# Patient Record
Sex: Female | Born: 1965 | Race: White | Hispanic: No | Marital: Married | Smoking: Never smoker
Health system: Southern US, Community
[De-identification: ages and names within clinical notes are randomized; demographics above are authoritative.]

## PROBLEM LIST (undated history)

## (undated) DIAGNOSIS — J45909 Unspecified asthma, uncomplicated: Secondary | ICD-10-CM

## (undated) DIAGNOSIS — R519 Headache, unspecified: Secondary | ICD-10-CM

## (undated) DIAGNOSIS — F32A Depression, unspecified: Secondary | ICD-10-CM

## (undated) DIAGNOSIS — R51 Headache: Secondary | ICD-10-CM

## (undated) DIAGNOSIS — F329 Major depressive disorder, single episode, unspecified: Secondary | ICD-10-CM

## (undated) HISTORY — DX: Unspecified asthma, uncomplicated: J45.909

## (undated) HISTORY — PX: GASTRIC BANDING PORT REVISION: SHX5246

## (undated) HISTORY — PX: ABDOMINAL HYSTERECTOMY: SHX81

## (undated) HISTORY — PX: CHOLECYSTECTOMY: SHX55

---

## 2016-09-17 ENCOUNTER — Other Ambulatory Visit (HOSPITAL_COMMUNITY): Payer: Self-pay | Admitting: Surgery

## 2016-09-29 ENCOUNTER — Ambulatory Visit (HOSPITAL_COMMUNITY)
Admission: RE | Admit: 2016-09-29 | Discharge: 2016-09-29 | Disposition: A | Discharge status: Home / Self Care | Payer: 59 | Source: Ambulatory Visit | Attending: Surgery | Admitting: Surgery

## 2016-09-29 ENCOUNTER — Other Ambulatory Visit: Payer: Self-pay

## 2016-09-29 DIAGNOSIS — Z9049 Acquired absence of other specified parts of digestive tract: Secondary | ICD-10-CM | POA: Insufficient documentation

## 2016-09-29 DIAGNOSIS — K219 Gastro-esophageal reflux disease without esophagitis: Secondary | ICD-10-CM | POA: Insufficient documentation

## 2016-10-01 ENCOUNTER — Other Ambulatory Visit: Payer: Self-pay | Admitting: Internal Medicine

## 2016-10-01 DIAGNOSIS — Z1231 Encounter for screening mammogram for malignant neoplasm of breast: Secondary | ICD-10-CM

## 2016-10-08 ENCOUNTER — Ambulatory Visit: Payer: Self-pay | Admitting: Registered"

## 2016-10-17 ENCOUNTER — Ambulatory Visit
Admission: RE | Admit: 2016-10-17 | Discharge: 2016-10-17 | Disposition: A | Discharge status: Home / Self Care | Payer: 59 | Source: Ambulatory Visit | Attending: Internal Medicine | Admitting: Internal Medicine

## 2016-10-17 DIAGNOSIS — Z1231 Encounter for screening mammogram for malignant neoplasm of breast: Secondary | ICD-10-CM

## 2016-10-20 ENCOUNTER — Encounter: Payer: Self-pay | Admitting: Skilled Nursing Facility1

## 2016-10-20 ENCOUNTER — Encounter: Payer: 59 | Attending: Surgery | Admitting: Skilled Nursing Facility1

## 2016-10-20 DIAGNOSIS — E669 Obesity, unspecified: Secondary | ICD-10-CM

## 2016-10-20 DIAGNOSIS — Z713 Dietary counseling and surveillance: Secondary | ICD-10-CM | POA: Diagnosis not present

## 2016-10-20 NOTE — Patient Instructions (Signed)
Follow Pre-Op Goals Try Protein Shakes Call [facility] at [phone] when surgery is scheduled to enroll in Pre-Op Class  Things to remember:  Please always be honest with us. We want to support you!  If you have any questions or concerns in between appointments, please call or email   The diet after surgery will be high protein and low in carbohydrate.  Vitamins and calcium need to be taken for the rest of your life.  Feel free to include support people in any classes or appointments.  Simple twist, crystal light, mio drips, coconut water: anything with no carbohydrates and no sugar is fine   Supplement recommendations:  Before Surgery   1 Complete Multivitamin with Iron  3000 IU Vitamin D3  After Surgery   2 Chewable Multivitamins  **Best Choice - Opurity: Bypass and Sleeve Optimized Chewable    Bariatric Advantage Advanced Multi EA      3 Chewable Calcium (500 mg each, total 1200-1500 mg per day)  **Best Choice - Celebrate, Bariatric Advantage, or Wellesse  Other Options:  2 Celebrate MultiComplete with 18 mg Iron (this provides 6000 IU of  Vitamin D3)  4 Celebrate Essential Multi 2 in 1 (has calcium) + 18-60 mg separate  iron  Vitamins and Calcium are available at:   [facility]   [address], [place], [place] [id number]   [email or web address]  [email or web address]  [email or web address]

## 2016-10-20 NOTE — Progress Notes (Signed)
Pre-Op Assessment Visit:  Pre-Operative Sleeve Gastrectomy Surgery  Medical Nutrition Therapy:  Appt start time: 4:10  End time:  5:10  Patient was seen on 10/20/2016 for Pre-Operative Nutrition Assessment. Assessment and letter of approval [facility] to [facility] coordinator on 10/20/2016.   Pt states she only gets about 16 ounces of fluid a day. Pt states she cannot stand any seafood stating the smell of fish makes her sick. Pt states if she can lose the weight she may consider not having surgery.   Pt expectation of surgery: "to help me lose weight and keep the weight off"  Pt expectation of Dietitian:  "To have someone to show me what to eat"  Start weight at [facility]: 256.11.2 BMI: 50.98  24 hr Dietary Recall: mostly skipping meals/irradic  First Meal:  Snack:  Second Meal: spagtti Snack:  Third Meal: popcorn  Snack:  Beverages: water, Gatorade, soda  Encouraged to engage in 150 minutes of moderate physical activity including cardiovascular and weight baring weekly  Handouts given during visit include:  . Pre-Op Goals . Bariatric Surgery Protein Shakes During the appointment today the following Pre-Op Goals were reviewed with the patient: . Maintain or lose weight as instructed by your surgeon . Make healthy food choices . Begin to limit portion sizes . Limited concentrated sugars and fried foods . Keep fat/sugar in the single digits per serving on             food labels . Practice CHEWING your food  (aim for 30 chews per bite or until applesauce consistency) . Practice not drinking 15 minutes before, during, and 30 minutes after each meal/snack . Avoid all carbonated beverages  . Avoid/limit caffeinated beverages  . Avoid all sugar-sweetened beverages . Consume 3 meals per day; eat every 3-5 hours . Make a list of non-food related activities . Aim for 64-100 ounces of FLUID daily  . Aim for at least 60-80 grams of PROTEIN  daily . Look for a liquid protein source that contain ?15 g protein and ?5 g carbohydrate  (ex: shakes, drinks, shots)  -Follow diet recommendations listed below   Energy and Macronutrient Recomendations: Calories: 1500 Carbohydrate: 170 Protein: 112 Fat: 42  Demonstrated degree of understanding via:  Teach Back  Teaching Method Utilized:  Visual Auditory Hands on  Barriers to learning/adherence to lifestyle change: has not worked on her relationship with food before  Patient to call the [facility] to enroll in Pre-Op and Post-Op Nutrition Education when surgery date is scheduled.

## 2016-11-20 ENCOUNTER — Ambulatory Visit: Payer: 59 | Admitting: Skilled Nursing Facility1

## 2016-12-11 ENCOUNTER — Ambulatory Visit: Payer: 59 | Admitting: Skilled Nursing Facility1

## 2016-12-15 ENCOUNTER — Encounter: Payer: Self-pay | Admitting: Skilled Nursing Facility1

## 2016-12-15 ENCOUNTER — Encounter: Payer: 59 | Attending: Surgery | Admitting: Skilled Nursing Facility1

## 2016-12-15 DIAGNOSIS — Z713 Dietary counseling and surveillance: Secondary | ICD-10-CM | POA: Diagnosis not present

## 2016-12-15 DIAGNOSIS — E669 Obesity, unspecified: Secondary | ICD-10-CM

## 2016-12-15 NOTE — Progress Notes (Signed)
  Assessment:   1st SWL Appointment.   Pt arrives having lost about 6 pounds. Pt states she mostly drinks water now. Pt states she has not eaten any ice cream since the last visit. Pt states her strategy was to just not do it. Pt states for the next appointment she was going to work on chewing until applesauce consistency and having a vegetable with every lunch and dinner consistently.    Start Wt at [facility]: 256.11.2 Wt: 250.12.8 pounds BMI: 49.81  MEDICATIONS: See List   DIETARY INTAKE:  24-hr recall:  B ( AM): cottage cheese with fruit Snk ( AM):  L ( PM): beef stew just beef  Snk ( PM):  D ( PM): beef stew just beef Snk ( PM):  Beverages: water  Usual physical activity: ADL's  Diet to Follow: 1400 calories 158 g carbohydrates 105 g protein 39 g fat   Nutritional Diagnosis:  [place]-3.3 Overweight/obesity related to past poor dietary habits and physical inactivity as evidenced by patient w/ planned sleeve gastrectomy surgery following dietary guidelines for continued weight loss.    Intervention:  Nutrition counseling for upcoming Bariatric Surgery. Goals: -Encouraged to engage in 150 minutes of moderate physical activity including cardiovascular and weight baring weekly  Teaching Method Utilized:  Visual Auditory Hands on  Barriers to learning/adherence to lifestyle change: none identified   Demonstrated degree of understanding via:  Teach Back   Monitoring/Evaluation:  Dietary intake, exercise, and body weight prn.

## 2017-01-01 ENCOUNTER — Ambulatory Visit: Payer: 59 | Admitting: Psychiatry

## 2017-01-12 ENCOUNTER — Encounter: Payer: Self-pay | Admitting: Skilled Nursing Facility1

## 2017-01-12 ENCOUNTER — Encounter: Payer: 59 | Attending: Surgery | Admitting: Skilled Nursing Facility1

## 2017-01-12 DIAGNOSIS — E669 Obesity, unspecified: Secondary | ICD-10-CM

## 2017-01-12 DIAGNOSIS — Z713 Dietary counseling and surveillance: Secondary | ICD-10-CM | POA: Insufficient documentation

## 2017-01-12 NOTE — Progress Notes (Signed)
  Assessment:   2nd SWL Appointment.   Pt arrives with a substantial migraine and uneasy on her feet. Pt states she Need 4 more visits. Pt states she is upset she has only lost 1 pound since her last visit.  Start Wt at [facility]: 256.11.2 Wt: 249 pounds BMI: 49.45  MEDICATIONS: See List   DIETARY INTAKE:  24-hr recall:  B ( AM): cottage cheese with fruit Snk ( AM):  L ( PM): cup of spagetti Snk ( PM):  D ( PM): meat and brussel sprouts  Snk ( PM):  Beverages: water 3-4 16.9 bottles of water  Usual physical activity: ADL's  Diet to Follow: 1400 calories 158 g carbohydrates 105 g protein 39 g fat   Nutritional Diagnosis:  [place]-3.3 Overweight/obesity related to past poor dietary habits and physical inactivity as evidenced by patient w/ planned sleeve gastrectomy surgery following dietary guidelines for continued weight loss.    Intervention:  Nutrition counseling for upcoming Bariatric Surgery. Goals: -Encouraged to engage in 150 minutes of moderate physical activity including cardiovascular and weight baring weekly -Have real meals: carbohydrate, vegetable, protein  Teaching Method Utilized:  Visual Auditory Hands on  Barriers to learning/adherence to lifestyle change: none identified   Demonstrated degree of understanding via:  Teach Back   Monitoring/Evaluation:  Dietary intake, exercise, and body weight prn.

## 2017-01-27 ENCOUNTER — Ambulatory Visit (INDEPENDENT_AMBULATORY_CARE_PROVIDER_SITE_OTHER): Payer: 59 | Admitting: Psychiatry

## 2017-01-27 DIAGNOSIS — F509 Eating disorder, unspecified: Secondary | ICD-10-CM

## 2017-02-11 ENCOUNTER — Encounter: Payer: 59 | Attending: Surgery | Admitting: Skilled Nursing Facility1

## 2017-02-11 ENCOUNTER — Encounter: Payer: Self-pay | Admitting: Skilled Nursing Facility1

## 2017-02-11 DIAGNOSIS — E669 Obesity, unspecified: Secondary | ICD-10-CM

## 2017-02-11 DIAGNOSIS — Z713 Dietary counseling and surveillance: Secondary | ICD-10-CM | POA: Insufficient documentation

## 2017-02-11 NOTE — Patient Instructions (Signed)
-  Get a multivitamin and take one every day  -15 grams of protein and 5 grams or less of carbohydrates

## 2017-02-11 NOTE — Progress Notes (Signed)
  Assessment:   3rd SWL Appointment.   Pt arrives having lost about 2 pound and a smaller jean size. Pt states she Need 3 more visits. Pt states she is upset she has only lost 1 pound since her last visit. Pt states she is a very slow eater. Pt states she is does not struggle with drinking with meals. Pt states the one thing she is going to struggle with the most is taking a multivitamin every day for the rest of her life.  Start Wt at [facility]: 256.11.2 Wt: 247 pounds BMI: 49.89  MEDICATIONS: See List   DIETARY INTAKE:  24-hr recall:  B ( AM): cottage cheese with fruit or yogurt Snk ( AM):  L ( PM): hamburger with tomatoes and brussel sprouts  Snk ( PM):  D ( PM): meat and brussel sprouts or soup and grilled cheese Snk ( PM):  Beverages: water 3-4 16.9 bottles of water  Usual physical activity: walking 20 minutes 4 days a week  Diet to Follow: 1400 calories 158 g carbohydrates 105 g protein 39 g fat   Nutritional Diagnosis:  [place]-3.3 Overweight/obesity related to past poor dietary habits and physical inactivity as evidenced by patient w/ planned sleeve gastrectomy surgery following dietary guidelines for continued weight loss.    Intervention:  Nutrition counseling for upcoming Bariatric Surgery. Goals: -Encouraged to engage in 150 minutes of moderate physical activity including cardiovascular and weight baring weekly -Get a multivitamin and take one every day -15 grams of protein and 5 grams or less of carbohydrates  Teaching Method Utilized:  Visual Auditory Hands on  Barriers to learning/adherence to lifestyle change: none identified   Demonstrated degree of understanding via:  Teach Back   Monitoring/Evaluation:  Dietary intake, exercise, and body weight prn.

## 2017-02-12 ENCOUNTER — Ambulatory Visit: Payer: Self-pay | Admitting: Skilled Nursing Facility1

## 2017-03-12 ENCOUNTER — Encounter: Payer: Self-pay | Admitting: Skilled Nursing Facility1

## 2017-03-12 ENCOUNTER — Encounter: Payer: 59 | Attending: Surgery | Admitting: Skilled Nursing Facility1

## 2017-03-12 DIAGNOSIS — Z713 Dietary counseling and surveillance: Secondary | ICD-10-CM | POA: Insufficient documentation

## 2017-03-12 DIAGNOSIS — E669 Obesity, unspecified: Secondary | ICD-10-CM

## 2017-03-12 NOTE — Progress Notes (Signed)
  Assessment:   4th SWL Appointment.    Pt states she is upset she has only lost 1 pound since her last visit. Pt states she is a very slow eater. Pt states she is does not struggle with drinking with meals. Pt states the one thing she is going to struggle with the most is taking a multivitamin every day for the rest of her life.  Pt arrived having lost about 3 pounds. Pt states she has had pnumonia. Pt states she has been taking a multivitamin every day. Pt states she has been on an antibiotic. Pt states she is not a vegetable person and absolutely hates asparagus. Pt state she needs 6 months of SWL.  Dietitian talked with pt about exploring different vegetables and how to make them to like them just as much as how she ate previously.   Needing 2 more visits.    Start Wt at [facility]: 256.11.2 Wt: 244 pounds BMI: 48.56  MEDICATIONS: See List   DIETARY INTAKE:  24-hr recall:  B ( AM): cottage cheese with fruit or yogurt Snk ( AM):  L ( PM): hamburger with tomatoes and brussel sprouts---soup Snk ( PM):  D ( PM): meat and brussel sprouts or soup and grilled cheese or mashed potaotes and peas Snk ( PM):  Beverages: water 3-4 16.9 bottles of water  Usual physical activity: walking 20 minutes 4 days a week (not since being sick)  Diet to Follow: 1400 calories 158 g carbohydrates 105 g protein 39 g fat   Nutritional Diagnosis:  [place]-3.3 Overweight/obesity related to past poor dietary habits and physical inactivity as evidenced by patient w/ planned sleeve gastrectomy surgery following dietary guidelines for continued weight loss.    Intervention:  Nutrition counseling for upcoming Bariatric Surgery. Goals: -Encouraged to engage in 150 minutes of moderate physical activity including cardiovascular and weight baring weekly -Try kefir in the yogurt  -Dabble in some vegetables: Google different recipes  -Work on seeing the success not just the number/Loving yourself   Teaching Method  Utilized:  Visual Auditory Hands on  Barriers to learning/adherence to lifestyle change: none identified   Demonstrated degree of understanding via:  Teach Back   Monitoring/Evaluation:  Dietary intake, exercise, and body weight prn.

## 2017-03-12 NOTE — Patient Instructions (Addendum)
-  Try kefir in the yogurt   -Dabble in some vegetables   -Work on seeing the success not just the number/Loving yourself

## 2017-03-17 ENCOUNTER — Ambulatory Visit: Payer: 59 | Admitting: Psychiatry

## 2017-04-15 ENCOUNTER — Encounter: Payer: 59 | Attending: Surgery | Admitting: Skilled Nursing Facility1

## 2017-04-15 ENCOUNTER — Encounter: Payer: Self-pay | Admitting: Skilled Nursing Facility1

## 2017-04-15 DIAGNOSIS — Z713 Dietary counseling and surveillance: Secondary | ICD-10-CM | POA: Diagnosis not present

## 2017-04-15 DIAGNOSIS — E669 Obesity, unspecified: Secondary | ICD-10-CM

## 2017-04-15 NOTE — Progress Notes (Signed)
  Assessment:   5th SWL Appointment.   Needing 1 more visit. Pt arrives having gained about 5 pounds.  Pt states she has been so sick from pneumonia and needing steroids she has been going to bed early without having dinner. Pt states after working 10 hour days she comes home tired.  Pt states she feels successful with: not drinking soda, not drinking with meals  Room to grow: eating 3 meals a day which will be worked on for the next month   [facility] 256.11.2 Wt: 249.3 pounds BMI: 49.51  MEDICATIONS: See List   DIETARY INTAKE:  24-hr recall:  B ( AM): cottage cheese with fruit or yogurt Snk ( AM):  L ( PM): hamburger with tomatoes and brussel sprouts---soup---hamburger with macaroni and corn---chili Snk ( PM):  D ( PM): meat and brussel sprouts or soup and grilled cheese or mashed potaotes and peas---skipping mostly or tomato soup with grilled cheese Snk ( PM):  Beverages: water 3-4 16.9 bottles of water, whole milk  Usual physical activity: walking 20 minutes 4 days a week (not since being sick)  Diet to Follow: 1400 calories 158 g carbohydrates 105 g protein 39 g fat   Nutritional Diagnosis:  [place]-3.3 Overweight/obesity related to past poor dietary habits and physical inactivity as evidenced by patient w/ planned sleeve gastrectomy surgery following dietary guidelines for continued weight loss.    Intervention:  Nutrition counseling for upcoming Bariatric Surgery. Goals: -Encouraged to engage in 150 minutes of moderate physical activity including cardiovascular and weight baring weekly -when not feeling very hungry but it has been over 5 hours: tomatoes, cucumber, cottage cheese, or yogurt -Say at least 1 nice thing to yourself every day  Teaching Method Utilized:  Visual Auditory Hands on  Barriers to learning/adherence to lifestyle change: none identified   Demonstrated degree of understanding via:  Teach Back   Monitoring/Evaluation:  Dietary intake,  exercise, and body weight prn.

## 2017-04-15 NOTE — Patient Instructions (Addendum)
-  when not feeling very hungry but it has been over 5 hours: tomatoes, cucumber, cottage cheese, or yogurt  -Say at least 1 nice thing to yourself every day

## 2017-04-23 ENCOUNTER — Ambulatory Visit: Payer: Self-pay | Admitting: Psychiatry

## 2017-04-30 ENCOUNTER — Ambulatory Visit (INDEPENDENT_AMBULATORY_CARE_PROVIDER_SITE_OTHER): Payer: Self-pay | Admitting: Psychiatry

## 2017-04-30 DIAGNOSIS — F509 Eating disorder, unspecified: Secondary | ICD-10-CM

## 2017-05-18 ENCOUNTER — Ambulatory Visit: Payer: Self-pay | Admitting: Skilled Nursing Facility1

## 2017-05-20 ENCOUNTER — Ambulatory Visit: Payer: Self-pay | Admitting: Skilled Nursing Facility1

## 2017-05-21 ENCOUNTER — Ambulatory Visit: Payer: Self-pay | Admitting: Skilled Nursing Facility1

## 2017-05-25 ENCOUNTER — Encounter: Payer: 59 | Attending: Surgery | Admitting: Skilled Nursing Facility1

## 2017-05-25 ENCOUNTER — Encounter: Payer: Self-pay | Admitting: Skilled Nursing Facility1

## 2017-05-25 DIAGNOSIS — E669 Obesity, unspecified: Secondary | ICD-10-CM

## 2017-05-25 DIAGNOSIS — Z713 Dietary counseling and surveillance: Secondary | ICD-10-CM | POA: Diagnosis not present

## 2017-05-25 NOTE — Progress Notes (Signed)
  Assessment:   6th SWL Appointment.  Pt states she is feeling irritated from being sick all the time with upper respiratory issues. Pt has completed her SWL appt.    Start Wt at [facility]: 256.11.2 Wt: 250 pounds BMI: 49.81  MEDICATIONS: See List   DIETARY INTAKE:  24-hr recall:  B ( AM): cottage cheese with fruit or yogurt or egg and toast and bacon or ham Snk ( AM): mixed nuts with raisins  L ( PM): hamburger with tomatoes and brussel sprouts---soup---hamburger with macaroni and corn---chili----popcorn  Snk ( PM):  D ( PM): meat and brussel sprouts or soup and grilled cheese or mashed potaotes and peas---skipping mostly or tomato soup with grilled cheese Snk ( PM):  Beverages: water 3-4 16.9 bottles of water, whole milk  Usual physical activity: walking 20 minutes 4 days a week (not since being sick); some chair exercises   Diet to Follow: 1400 calories 158 g carbohydrates 105 g protein 39 g fat   Nutritional Diagnosis:  [place]-3.3 Overweight/obesity related to past poor dietary habits and physical inactivity as evidenced by patient w/ planned sleeve gastrectomy surgery following dietary guidelines for continued weight loss.    Intervention:  Nutrition counseling for upcoming Bariatric Surgery. Goals: -Encouraged to engage in 150 minutes of moderate physical activity including cardiovascular and weight baring weekly -when not feeling very hungry but it has been over 5 hours: tomatoes, cucumber, cottage cheese, or yogurt -Say at least 1 nice thing to yourself every day  Teaching Method Utilized:  Visual Auditory Hands on  Barriers to learning/adherence to lifestyle change: none identified   Demonstrated degree of understanding via:  Teach Back   Monitoring/Evaluation:  Dietary intake, exercise, and body weight prn.

## 2017-06-08 ENCOUNTER — Encounter: Payer: 59 | Admitting: Skilled Nursing Facility1

## 2017-06-08 DIAGNOSIS — E669 Obesity, unspecified: Secondary | ICD-10-CM

## 2017-06-10 ENCOUNTER — Encounter: Payer: Self-pay | Admitting: Skilled Nursing Facility1

## 2017-06-10 NOTE — Progress Notes (Signed)
Pre-Operative Nutrition Class:  Appt start time: 1840   End time:  1830.  Patient was seen on 06/08/2017 for Pre-Operative Bariatric Surgery Education [facility].   Surgery date:  Surgery type: sleeve Start weight at [facility]: 256.11 Weight today: 251.4  Samples given per MNT protocol. Patient educated on appropriate usage: Bariatric Advantage Multivitamin Lot # [id number] Exp: 07/19  Bariatric Advantage Calcium  Lot # [id number] Exp: Jan 24 2018  Unjury Protein Powder   Lot # [id number] Exp: 04/2019  The following the learning objectives were met by the patient during this course:  Identify Pre-Op Dietary Goals and will begin 2 weeks pre-operatively  Identify appropriate sources of fluids and proteins   State protein recommendations and appropriate sources pre and post-operatively  Identify Post-Operative Dietary Goals and will follow for 2 weeks post-operatively  Identify appropriate multivitamin and calcium sources  Describe the need for physical activity post-operatively and will follow MD recommendations  State when to call healthcare provider regarding medication questions or post-operative complications  Handouts given during class include:  Pre-Op Bariatric Surgery Diet Handout  Protein Shake Handout  Post-Op Bariatric Surgery Nutrition Handout  [facility] Program Information Flyer  Support Group Information Flyer  [facility] Bariatric Supplements Price List  Follow-Up Plan: Patient will follow-up at [facility] 2 weeks post operatively for diet advancement per MD.

## 2017-08-17 MED FILL — oxyCODONE HCL 5 MG/5ML SOLN: 5 | 2 days supply | Qty: 120 | Fill #0

## 2017-08-17 MED FILL — ONDANSETRON ODT 4 MG TABLET: 4 | 6 days supply | Qty: 20 | Fill #0

## 2017-08-17 MED FILL — PANTOPRAZOLE SOD DR 40 MG T: 40 | 21 days supply | Qty: 21 | Fill #0

## 2017-08-25 NOTE — Patient Instructions (Addendum)
Glyn AdeLola Stueve  08/25/2017   Your procedure is scheduled on: Tuesday 09/01/2017  [facility]  [facility]   ARRIVE AT 530 AM. Have a seat in the Main Lobby. Please note there is a phone at the [employer]k. Please call [phone] on that phone. Someone from Short Stay will come and get you from the Main Lobby and take you to Short Stay.   Call this number if you have problems the morning of surgery [phone]    Remember: Do not eat food or drink liquids :After Midnight.      Take these medicines the morning of surgery with A SIP OF WATER: Bupropion (Wellbutrin XL), use Albuterol inhaler if needed and use Breo Ellipta inhaler and bring inhalers with you to the hospital                                You may not have any metal on your body including hair pins and              piercings  Do not wear jewelry, make-up, lotions, powders or perfumes, deodorant             Do not wear nail polish.  Do not shave  48 hours prior to surgery.              Men may shave face and neck.   Do not bring valuables to the hospital. [facility] IS NOT             RESPONSIBLE   FOR VALUABLES.  Contacts, dentures or bridgework may not be worn into surgery.  Leave suitcase in the car. After surgery it may be brought to your room.                  Please read over the following fact sheets you were given: _____________________________________________________________________             [facility] - Preparing for [facility] Before surgery, you can play an important role.  Because skin is not sterile, your skin needs to be as free of germs as possible.  You can reduce the number of germs on your skin by washing with CHG (chlorahexidine gluconate) soap before surgery.  CHG is an antiseptic cleaner which kills germs and bonds with the skin to continue killing germs even after washing. Please DO NOT use if you have an allergy to CHG or antibacterial soaps.  If  your skin becomes reddened/irritated stop using the CHG and inform your nurse when you arrive at Short Stay. Do not shave (including legs and underarms) for at least 48 hours prior to the first CHG shower.  You may shave your face/neck. Please follow these instructions carefully:  1.  Shower with CHG Soap the night before surgery and the  morning of Surgery.  2.  If you choose to wash your hair, wash your hair first as usual with your  normal  shampoo.  3.  After you shampoo, rinse your hair and body thoroughly to remove the  shampoo.                           4.  Use CHG as you would any other liquid soap.  You can apply chg directly  to the skin  and wash                       Gently with a scrungie or clean washcloth.  5.  Apply the CHG Soap to your body ONLY FROM THE NECK DOWN.   Do not use on face/ open                           Wound or open sores. Avoid contact with eyes, ears mouth and genitals (private parts).                       Wash face,  Genitals (private parts) with your normal soap.             6.  Wash thoroughly, paying special attention to the area where your surgery  will be performed.  7.  Thoroughly rinse your body with warm water from the neck down.  8.  DO NOT shower/wash with your normal soap after using and rinsing off  the CHG Soap.                9.  Pat yourself dry with a clean towel.            10.  Wear clean pajamas.            11.  Place clean sheets on your bed the night of your first shower and do not  sleep with pets. Day of Surgery : Do not apply any lotions/deodorants the morning of surgery.  Please wear clean clothes to the hospital/surgery center.  FAILURE TO FOLLOW THESE INSTRUCTIONS MAY RESULT IN THE CANCELLATION OF YOUR SURGERY PATIENT SIGNATURE_________________________________  NURSE SIGNATURE__________________________________  ________________________________________________________________________   Adam Phenix  An incentive  spirometer is a tool that can help keep your lungs clear and active. This tool measures how well you are filling your lungs with each breath. Taking long deep breaths may help reverse or decrease the chance of developing breathing (pulmonary) problems (especially infection) following:  A long period of time when you are unable to move or be active. BEFORE THE PROCEDURE   If the spirometer includes an indicator to show your best effort, your nurse or respiratory therapist will set it to a desired goal.  If possible, sit up straight or lean slightly forward. Try not to slouch.  Hold the incentive spirometer in an upright position. INSTRUCTIONS FOR USE  1. Sit on the edge of your bed if possible, or sit up as far as you can in bed or on a chair. 2. Hold the incentive spirometer in an upright position. 3. Breathe out normally. 4. Place the mouthpiece in your mouth and seal your lips tightly around it. 5. Breathe in slowly and as deeply as possible, raising the piston or the ball toward the top of the column. 6. Hold your breath for 3-5 seconds or for as long as possible. Allow the piston or ball to fall to the bottom of the column. 7. Remove the mouthpiece from your mouth and breathe out normally. 8. Rest for a few seconds and repeat Steps 1 through 7 at least 10 times every 1-2 hours when you are awake. Take your time and take a few normal breaths between deep breaths. 9. The spirometer may include an indicator to show your best effort. Use the indicator as a goal to work toward during each repetition. 10. After each set of 10  deep breaths, practice coughing to be sure your lungs are clear. If you have an incision (the cut made at the time of surgery), support your incision when coughing by placing a pillow or rolled up towels firmly against it. Once you are able to get out of bed, walk around indoors and cough well. You may stop using the incentive spirometer when instructed by your caregiver.   RISKS AND COMPLICATIONS  Take your time so you do not get dizzy or light-headed.  If you are in pain, you may need to take or ask for pain medication before doing incentive spirometry. It is harder to take a deep breath if you are having pain. AFTER USE  Rest and breathe slowly and easily.  It can be helpful to keep track of a log of your progress. Your caregiver can provide you with a simple table to help with this. If you are using the spirometer at home, follow these instructions: [place] IF:   You are having difficultly using the spirometer.  You have trouble using the spirometer as often as instructed.  Your pain medication is not giving enough relief while using the spirometer.  You develop fever of 100.5 F (38.1 C) or higher. SEEK IMMEDIATE MEDICAL CARE IF:   You cough up bloody sputum that had not been present before.  You develop fever of 102 F (38.9 C) or greater.  You develop worsening pain at or near the incision site. MAKE SURE YOU:   Understand these instructions.  Will watch your condition.  Will get help right away if you are not doing well or get worse. Document Released: 09/15/2006 Document Revised: 07/28/2011 Document Reviewed: 11/16/2006 [facility] Patient Information 2014 [place], [place].   ________________________________________________________________________

## 2017-08-26 ENCOUNTER — Encounter (HOSPITAL_COMMUNITY)
Admission: RE | Admit: 2017-08-26 | Discharge: 2017-08-26 | Disposition: A | Discharge status: Home / Self Care | Payer: 59 | Source: Ambulatory Visit | Attending: Surgery | Admitting: Surgery

## 2017-08-26 ENCOUNTER — Encounter (HOSPITAL_COMMUNITY): Payer: Self-pay

## 2017-08-26 ENCOUNTER — Other Ambulatory Visit: Payer: Self-pay

## 2017-08-26 DIAGNOSIS — Z01812 Encounter for preprocedural laboratory examination: Secondary | ICD-10-CM | POA: Insufficient documentation

## 2017-08-26 HISTORY — DX: Headache: R51

## 2017-08-26 HISTORY — DX: Major depressive disorder, single episode, unspecified: F32.9

## 2017-08-26 HISTORY — DX: Depression, unspecified: F32.A

## 2017-08-26 HISTORY — DX: Headache, unspecified: R51.9

## 2017-08-26 LAB — BASIC METABOLIC PANEL
ANION GAP: 9 (ref 5–15)
BUN: 18 mg/dL (ref 6–20)
CALCIUM: 9.8 mg/dL (ref 8.9–10.3)
CO2: 24 mmol/L (ref 22–32)
Chloride: 106 mmol/L (ref 101–111)
Creatinine, Ser: 0.54 mg/dL (ref 0.44–1.00)
Glucose, Bld: 93 mg/dL (ref 65–99)
Potassium: 3.9 mmol/L (ref 3.5–5.1)
SODIUM: 139 mmol/L (ref 135–145)

## 2017-08-26 LAB — CBC
HCT: 42 % (ref 36.0–46.0)
Hemoglobin: 14.4 g/dL (ref 12.0–15.0)
MCH: 32.1 pg (ref 26.0–34.0)
MCHC: 34.3 g/dL (ref 30.0–36.0)
MCV: 93.8 fL (ref 78.0–100.0)
PLATELETS: 286 10*3/uL (ref 150–400)
RBC: 4.48 MIL/uL (ref 3.87–5.11)
RDW: 13 % (ref 11.5–15.5)
WBC: 11 10*3/uL — AB (ref 4.0–10.5)

## 2017-08-26 NOTE — Progress Notes (Signed)
09/29/2016- noted in Epic- EKG and CXR

## 2017-08-27 NOTE — H&P (Signed)
Chief Complaint:  Weight regain after removal of lapband  History of Present Illness:  Claudia Cole is an 52 y.o. female who previously had a Lapband placed in [place] and because of intractable nausea and vomiting had it removed in [place], [place].   Since then her BMI has justed from the low 40s o the 50s.    Informed consent obtained in the office regarding sleeve gastrectomy with its increased risks due to her prior lapband surgery.  She understands and accepts these risks.    Past Medical History:  Diagnosis Date  . Asthma    seasonal  . Depression   . Headache    migraines    Past Surgical History:  Procedure Laterality Date  . ABDOMINAL HYSTERECTOMY    . CESAREAN SECTION    . CHOLECYSTECTOMY    . GASTRIC BANDING PORT REVISION      No current facility-administered medications for this encounter.    Current Outpatient Medications  Medication Sig Dispense Refill  . albuterol (PROVENTIL HFA;VENTOLIN HFA) 108 (90 Base) MCG/ACT inhaler Inhale 1 puff into the lungs every 6 (six) hours as needed.    Marland Kitchen buPROPion (WELLBUTRIN XL) 300 MG 24 hr tablet Take 300 mg by mouth every morning.  5  . Calcium Citrate-Vitamin D (CALCIUM CITRATE CHEWY BITE PO) Take 1 each by mouth daily.    . fluticasone furoate-vilanterol (BREO ELLIPTA) 200-25 MCG/INH AEPB Inhale 1 puff into the lungs daily.    Marland Kitchen levocetirizine (XYZAL) 5 MG tablet Take 5 mg by mouth every evening.    . meloxicam (MOBIC) 7.5 MG tablet Take 1 tablet by mouth daily.  0  . montelukast (SINGULAIR) 10 MG tablet Take 1 tablet by mouth every evening. Around 4p  5  . Multiple Vitamins-Minerals (BARIATRIC FUSION PO) Take 1 tablet by mouth 2 (two) times daily.    Marland Kitchen zolpidem (AMBIEN CR) 12.5 MG CR tablet Take 1 tablet by mouth at bedtime.  2   Keflex [cephalexin] Family History  Problem Relation Age of Onset  . Asthma Other   . Cancer Other   . Diabetes Other   . Heart disease Other   . COPD Other    Social History:   reports that she has  never smoked. She has never used smokeless tobacco. She reports that she drinks alcohol. She reports that she does not use drugs.   REVIEW OF SYSTEMS : Negative except for negative for dVT  Physical Exam:   There were no vitals taken for this visit. There is no height or weight on file to calculate BMI.  Gen:  WDWN WF NAD  Neurological: Alert and oriented to person, place, and time. Motor and sensory function is grossly intact  Head: Normocephalic and atraumatic-glasses  Eyes: Conjunctivae are normal. Pupils are equal, round, and reactive to light. No scleral icterus.  Neck: Normal range of motion. Neck supple. No tracheal deviation or thyromegaly present.  Cardiovascular:  SR without murmurs or gallops.  No carotid bruits Breast:  Not examined Respiratory: Effort normal.  No respiratory distress. No chest wall tenderness. Breath sounds normal.  No wheezes, rales or rhonchi.  Abdomen:  Prior laparoscopic scars GU:  Not examined.  Musculoskeletal: Normal range of motion. Extremities are nontender. No cyanosis, edema or clubbing noted Lymphadenopathy: No cervical, preauricular, postauricular or axillary adenopathy is present Skin: Skin is warm and dry. No rash noted. No diaphoresis. No erythema. No pallor. Pscyh: Normal mood and affect. Behavior is normal. Judgment and thought content normal.  LABORATORY RESULTS: Results for orders placed or performed during the hospital encounter of 08/26/17 (from the past 48 hour(s))  Basic metabolic panel     Status: None   Collection Time: 08/26/17  2:35 PM  Result Value Ref Range   Sodium 139 135 - 145 mmol/L   Potassium 3.9 3.5 - 5.1 mmol/L   Chloride 106 101 - 111 mmol/L   CO2 24 22 - 32 mmol/L   Glucose, Bld 93 65 - 99 mg/dL   BUN 18 6 - 20 mg/dL   Creatinine, Ser 0.54 0.44 - 1.00 mg/dL   Calcium 9.8 8.9 - 10.3 mg/dL   GFR calc non Af Amer >60 >60 mL/min   GFR calc Af Amer >60 >60 mL/min    Comment: (NOTE) The eGFR has been calculated  using the CKD EPI equation. This calculation has not been validated in all clinical situations. eGFR's persistently <60 mL/min signify possible Chronic Kidney Disease.    Anion gap 9 5 - 15    Comment: Performed at [facility], [place] [address] [place], [place] [address]  CBC     Status: Abnormal   Collection Time: 08/26/17  2:35 PM  Result Value Ref Range   WBC 11.0 (H) 4.0 - 10.5 K/uL   RBC 4.48 3.87 - 5.11 MIL/uL   Hemoglobin 14.4 12.0 - 15.0 g/dL   HCT 42.0 36.0 - 46.0 %   MCV 93.8 78.0 - 100.0 fL   MCH 32.1 26.0 - 34.0 pg   MCHC 34.3 30.0 - 36.0 g/dL   RDW 13.0 11.5 - 15.5 %   Platelets 286 150 - 400 K/uL    Comment: Performed at [facility], [place] [address] [place], [place] [address]     RADIOLOGY RESULTS: No results found.  Problem List: There are no active problems to display for this patient.   Assessment & Plan: BMI 50 for sleeve gastrectomy    Matt B. Hassell Done, MD, [facility] [phone] beeper [phone]  08/27/2017 9:07 PM

## 2017-09-01 ENCOUNTER — Inpatient Hospital Stay (HOSPITAL_COMMUNITY): Payer: 59 | Admitting: Certified Registered Nurse Anesthetist

## 2017-09-01 ENCOUNTER — Other Ambulatory Visit: Payer: Self-pay

## 2017-09-01 ENCOUNTER — Encounter (HOSPITAL_COMMUNITY): Payer: Self-pay

## 2017-09-01 ENCOUNTER — Inpatient Hospital Stay (HOSPITAL_COMMUNITY)
Admission: RE | Admit: 2017-09-01 | Discharge: 2017-09-02 | DRG: 621 | Disposition: A | Discharge status: Home / Self Care | Payer: 59 | Attending: Surgery | Admitting: Surgery

## 2017-09-01 ENCOUNTER — Encounter (HOSPITAL_COMMUNITY)
Admission: RE | Disposition: A | Discharge status: Home / Self Care | Payer: Self-pay | Source: Home / Self Care | Attending: Surgery

## 2017-09-01 DIAGNOSIS — Z791 Long term (current) use of non-steroidal anti-inflammatories (NSAID): Secondary | ICD-10-CM

## 2017-09-01 DIAGNOSIS — Z7951 Long term (current) use of inhaled steroids: Secondary | ICD-10-CM

## 2017-09-01 DIAGNOSIS — Z825 Family history of asthma and other chronic lower respiratory diseases: Secondary | ICD-10-CM

## 2017-09-01 DIAGNOSIS — Z6841 Body Mass Index (BMI) 40.0 and over, adult: Secondary | ICD-10-CM | POA: Diagnosis not present

## 2017-09-01 DIAGNOSIS — Z9884 Bariatric surgery status: Secondary | ICD-10-CM

## 2017-09-01 DIAGNOSIS — Z79899 Other long term (current) drug therapy: Secondary | ICD-10-CM | POA: Diagnosis not present

## 2017-09-01 DIAGNOSIS — F329 Major depressive disorder, single episode, unspecified: Secondary | ICD-10-CM | POA: Diagnosis present

## 2017-09-01 DIAGNOSIS — J45909 Unspecified asthma, uncomplicated: Secondary | ICD-10-CM | POA: Diagnosis present

## 2017-09-01 DIAGNOSIS — Z90710 Acquired absence of both cervix and uterus: Secondary | ICD-10-CM | POA: Diagnosis not present

## 2017-09-01 HISTORY — PX: LAPAROSCOPIC GASTRIC SLEEVE RESECTION: SHX5895

## 2017-09-01 LAB — COMPREHENSIVE METABOLIC PANEL
ALBUMIN: 3.9 g/dL (ref 3.5–5.0)
ALK PHOS: 72 U/L (ref 38–126)
ALT: 33 U/L (ref 14–54)
ANION GAP: 14 (ref 5–15)
AST: 24 U/L (ref 15–41)
BUN: 15 mg/dL (ref 6–20)
CALCIUM: 9.3 mg/dL (ref 8.9–10.3)
CO2: 22 mmol/L (ref 22–32)
Chloride: 105 mmol/L (ref 101–111)
Creatinine, Ser: 0.64 mg/dL (ref 0.44–1.00)
GFR calc Af Amer: >60 mL/min (ref >60)
GFR calc non Af Amer: >60 mL/min (ref >60)
GLUCOSE: 85 mg/dL (ref 65–99)
Potassium: 3.4 mmol/L — ABNORMAL LOW (ref 3.5–5.1)
SODIUM: 141 mmol/L (ref 135–145)
Total Bilirubin: 1.1 mg/dL (ref 0.3–1.2)
Total Protein: 7.4 g/dL (ref 6.5–8.1)

## 2017-09-01 LAB — CBC
HCT: 40.7 % (ref 36.0–46.0)
Hemoglobin: 13.6 g/dL (ref 12.0–15.0)
MCH: 31.6 pg (ref 26.0–34.0)
MCHC: 33.4 g/dL (ref 30.0–36.0)
MCV: 94.4 fL (ref 78.0–100.0)
PLATELETS: 250 10*3/uL (ref 150–400)
RBC: 4.31 MIL/uL (ref 3.87–5.11)
RDW: 12.9 % (ref 11.5–15.5)
WBC: 12.4 10*3/uL — AB (ref 4.0–10.5)

## 2017-09-01 LAB — CREATININE, SERUM
Creatinine, Ser: 0.63 mg/dL (ref 0.44–1.00)
GFR calc non Af Amer: >60 mL/min (ref >60)

## 2017-09-01 LAB — TYPE AND SCREEN
ABO/RH(D): A POS
Antibody Screen: NEGATIVE

## 2017-09-01 LAB — ABO/RH: ABO/RH(D): A POS

## 2017-09-01 SURGERY — GASTRECTOMY, SLEEVE, LAPAROSCOPIC
Anesthesia: General | Site: Abdomen

## 2017-09-01 MED ORDER — FENTANYL CITRATE (PF) 100 MCG/2ML IJ SOLN
INTRAMUSCULAR | Status: AC
Start: 2017-09-01 — End: 2017-09-01
  Administered 2017-09-01: 25 ug via INTRAVENOUS
  Filled 2017-09-01: qty 2

## 2017-09-01 MED ORDER — ONDANSETRON HCL 4 MG/2ML IJ SOLN
4.0000 mg | INTRAMUSCULAR | Status: DC | PRN
  Administered 2017-09-01: 4 mg via INTRAVENOUS
  Filled 2017-09-01: qty 2

## 2017-09-01 MED ORDER — PROPOFOL 10 MG/ML IV BOLUS
INTRAVENOUS | Status: DC | PRN
  Administered 2017-09-01: 180 mg via INTRAVENOUS

## 2017-09-01 MED ORDER — FAMOTIDINE IN NACL 20-0.9 MG/50ML-% IV SOLN
20.0000 mg | Freq: Two times a day (BID) | INTRAVENOUS | Status: DC
  Administered 2017-09-01 – 2017-09-02 (×2): 20 mg via INTRAVENOUS
  Filled 2017-09-01 (×2): qty 50

## 2017-09-01 MED ORDER — ALBUTEROL SULFATE (2.5 MG/3ML) 0.083% IN NEBU: 2.5000 mg | INHALATION_SOLUTION | Freq: Four times a day (QID) | RESPIRATORY_TRACT | Status: DC | PRN

## 2017-09-01 MED ORDER — ONDANSETRON HCL 4 MG/2ML IJ SOLN
INTRAMUSCULAR | Status: DC | PRN
  Administered 2017-09-01: 4 mg via INTRAVENOUS

## 2017-09-01 MED ORDER — ROCURONIUM BROMIDE 10 MG/ML (PF) SYRINGE
PREFILLED_SYRINGE | INTRAVENOUS | Status: DC | PRN
  Administered 2017-09-01: 20 mg via INTRAVENOUS
  Administered 2017-09-01: 40 mg via INTRAVENOUS
  Administered 2017-09-01: 10 mg via INTRAVENOUS

## 2017-09-01 MED ORDER — GLYCOPYRROLATE 0.2 MG/ML IV SOSY
PREFILLED_SYRINGE | INTRAVENOUS | Status: DC | PRN
  Administered 2017-09-01: .2 mg via INTRAVENOUS

## 2017-09-01 MED ORDER — CHLORHEXIDINE GLUCONATE CLOTH 2 % EX PADS: 6.0000 | MEDICATED_PAD | Freq: Once | CUTANEOUS | Status: DC

## 2017-09-01 MED ORDER — HEPARIN SODIUM (PORCINE) 5000 UNIT/ML IJ SOLN
5000.0000 [IU] | INTRAMUSCULAR | Status: AC
  Administered 2017-09-01: 5000 [IU] via SUBCUTANEOUS
  Filled 2017-09-01: qty 1

## 2017-09-01 MED ORDER — PROMETHAZINE HCL 25 MG/ML IJ SOLN
INTRAMUSCULAR | Status: AC
  Filled 2017-09-01: qty 1

## 2017-09-01 MED ORDER — FENTANYL CITRATE (PF) 100 MCG/2ML IJ SOLN
25.0000 ug | INTRAMUSCULAR | Status: DC | PRN
  Administered 2017-09-01 (×2): 25 ug via INTRAVENOUS

## 2017-09-01 MED ORDER — DEXAMETHASONE SODIUM PHOSPHATE 4 MG/ML IJ SOLN: 4.0000 mg | INTRAMUSCULAR | Status: DC

## 2017-09-01 MED ORDER — CEFOTETAN DISODIUM-DEXTROSE 2-2.08 GM-%(50ML) IV SOLR
INTRAVENOUS | Status: AC
  Filled 2017-09-01: qty 50

## 2017-09-01 MED ORDER — OXYCODONE HCL 5 MG PO TABS: 5.0000 mg | ORAL_TABLET | Freq: Once | ORAL | Status: DC | PRN

## 2017-09-01 MED ORDER — CIPROFLOXACIN IN D5W 400 MG/200ML IV SOLN
INTRAVENOUS | Status: AC
  Filled 2017-09-01: qty 200

## 2017-09-01 MED ORDER — LACTATED RINGERS IR SOLN
Status: DC | PRN
  Administered 2017-09-01: 1000 mL

## 2017-09-01 MED ORDER — CEFOTETAN DISODIUM-DEXTROSE 2-2.08 GM-%(50ML) IV SOLR: 2.0000 g | INTRAVENOUS | Status: DC

## 2017-09-01 MED ORDER — FLUTICASONE FUROATE-VILANTEROL 200-25 MCG/INH IN AEPB
1.0000 | INHALATION_SPRAY | Freq: Every day | RESPIRATORY_TRACT | Status: DC
  Administered 2017-09-02: 1 via RESPIRATORY_TRACT
  Filled 2017-09-01: qty 28

## 2017-09-01 MED ORDER — FENTANYL CITRATE (PF) 100 MCG/2ML IJ SOLN
INTRAMUSCULAR | Status: DC | PRN
  Administered 2017-09-01 (×2): 100 ug via INTRAVENOUS

## 2017-09-01 MED ORDER — MIDAZOLAM HCL 2 MG/2ML IJ SOLN
INTRAMUSCULAR | Status: AC
  Filled 2017-09-01: qty 2

## 2017-09-01 MED ORDER — GABAPENTIN 300 MG PO CAPS
300.0000 mg | ORAL_CAPSULE | ORAL | Status: AC
  Administered 2017-09-01: 300 mg via ORAL
  Filled 2017-09-01: qty 1

## 2017-09-01 MED ORDER — EPHEDRINE SULFATE-NACL 50-0.9 MG/10ML-% IV SOSY
PREFILLED_SYRINGE | INTRAVENOUS | Status: DC | PRN
  Administered 2017-09-01: 15 mg via INTRAVENOUS

## 2017-09-01 MED ORDER — LACTATED RINGERS IV SOLN
INTRAVENOUS | Status: DC
  Administered 2017-09-01 (×3): via INTRAVENOUS

## 2017-09-01 MED ORDER — HEPARIN SODIUM (PORCINE) 5000 UNIT/ML IJ SOLN
5000.0000 [IU] | Freq: Three times a day (TID) | INTRAMUSCULAR | Status: DC
  Administered 2017-09-01 – 2017-09-02 (×3): 5000 [IU] via SUBCUTANEOUS
  Filled 2017-09-01 (×3): qty 1

## 2017-09-01 MED ORDER — SALINE SPRAY 0.65 % NA SOLN
1.0000 | NASAL | Status: DC | PRN
  Administered 2017-09-01: 1 via NASAL
  Filled 2017-09-01: qty 44

## 2017-09-01 MED ORDER — CELECOXIB 200 MG PO CAPS
400.0000 mg | ORAL_CAPSULE | ORAL | Status: AC
  Administered 2017-09-01: 400 mg via ORAL
  Filled 2017-09-01: qty 2

## 2017-09-01 MED ORDER — SUCCINYLCHOLINE CHLORIDE 20 MG/ML IJ SOLN
INTRAMUSCULAR | Status: DC | PRN
  Administered 2017-09-01: 120 mg via INTRAVENOUS

## 2017-09-01 MED ORDER — MORPHINE SULFATE (PF) 2 MG/ML IV SOLN: 1.0000 mg | INTRAVENOUS | Status: DC | PRN

## 2017-09-01 MED ORDER — SODIUM CHLORIDE 0.9 % IJ SOLN
INTRAMUSCULAR | Status: AC
  Filled 2017-09-01: qty 10

## 2017-09-01 MED ORDER — ONDANSETRON HCL 4 MG/2ML IJ SOLN
INTRAMUSCULAR | Status: AC
  Filled 2017-09-01: qty 2

## 2017-09-01 MED ORDER — PREMIER PROTEIN SHAKE
2.0000 [oz_av] | ORAL | Status: DC
  Administered 2017-09-02 (×3): 2 [oz_av] via ORAL

## 2017-09-01 MED ORDER — KCL IN DEXTROSE-NACL 20-5-0.9 MEQ/L-%-% IV SOLN
INTRAVENOUS | Status: DC
  Filled 2017-09-01: qty 1000

## 2017-09-01 MED ORDER — KCL IN DEXTROSE-NACL 20-5-0.45 MEQ/L-%-% IV SOLN
INTRAVENOUS | Status: DC
  Administered 2017-09-01: 15:00:00 via INTRAVENOUS
  Filled 2017-09-01 (×2): qty 1000

## 2017-09-01 MED ORDER — SCOPOLAMINE 1 MG/3DAYS TD PT72
1.0000 | MEDICATED_PATCH | TRANSDERMAL | Status: DC
  Administered 2017-09-01: 1.5 mg via TRANSDERMAL
  Filled 2017-09-01: qty 1

## 2017-09-01 MED ORDER — PHENYLEPHRINE 40 MCG/ML (10ML) SYRINGE FOR IV PUSH (FOR BLOOD PRESSURE SUPPORT)
PREFILLED_SYRINGE | INTRAVENOUS | Status: DC | PRN
  Administered 2017-09-01 (×3): 120 ug via INTRAVENOUS

## 2017-09-01 MED ORDER — SUGAMMADEX SODIUM 200 MG/2ML IV SOLN
INTRAVENOUS | Status: DC | PRN
  Administered 2017-09-01: 250 mg via INTRAVENOUS

## 2017-09-01 MED ORDER — PROMETHAZINE HCL 25 MG/ML IJ SOLN: 6.2500 mg | INTRAMUSCULAR | Status: DC | PRN

## 2017-09-01 MED ORDER — ONDANSETRON HCL 4 MG/2ML IJ SOLN
4.0000 mg | Freq: Four times a day (QID) | INTRAMUSCULAR | Status: AC | PRN
  Administered 2017-09-01: 4 mg via INTRAVENOUS

## 2017-09-01 MED ORDER — MIDAZOLAM HCL 5 MG/5ML IJ SOLN
INTRAMUSCULAR | Status: DC | PRN
  Administered 2017-09-01: 2 mg via INTRAVENOUS

## 2017-09-01 MED ORDER — OXYCODONE HCL 5 MG/5ML PO SOLN
5.0000 mg | Freq: Once | ORAL | Status: DC | PRN
  Filled 2017-09-01: qty 5

## 2017-09-01 MED ORDER — OXYCODONE HCL 5 MG/5ML PO SOLN: 5.0000 mg | ORAL | Status: DC | PRN

## 2017-09-01 MED ORDER — SODIUM CHLORIDE 0.9 % IJ SOLN
INTRAMUSCULAR | Status: DC | PRN
  Administered 2017-09-01: 10 mL

## 2017-09-01 MED ORDER — FENTANYL CITRATE (PF) 250 MCG/5ML IJ SOLN
INTRAMUSCULAR | Status: AC
  Filled 2017-09-01: qty 5

## 2017-09-01 MED ORDER — APREPITANT 40 MG PO CAPS
40.0000 mg | ORAL_CAPSULE | ORAL | Status: AC
  Administered 2017-09-01: 40 mg via ORAL
  Filled 2017-09-01: qty 1

## 2017-09-01 MED ORDER — ACETAMINOPHEN 160 MG/5ML PO SOLN
650.0000 mg | Freq: Four times a day (QID) | ORAL | Status: DC
  Administered 2017-09-01 – 2017-09-02 (×3): 650 mg via ORAL
  Filled 2017-09-01 (×3): qty 20.3

## 2017-09-01 MED ORDER — BUPIVACAINE LIPOSOME 1.3 % IJ SUSP
20.0000 mL | Freq: Once | INTRAMUSCULAR | Status: AC
  Administered 2017-09-01: 20 mL
  Filled 2017-09-01: qty 20

## 2017-09-01 MED ORDER — KETAMINE HCL 10 MG/ML IJ SOLN
INTRAMUSCULAR | Status: DC | PRN
  Administered 2017-09-01: 30 mg via INTRAVENOUS

## 2017-09-01 MED ORDER — CIPROFLOXACIN IN D5W 400 MG/200ML IV SOLN
INTRAVENOUS | Status: DC | PRN
  Administered 2017-09-01: 400 mg via INTRAVENOUS

## 2017-09-01 MED ORDER — PROPOFOL 10 MG/ML IV BOLUS
INTRAVENOUS | Status: AC
  Filled 2017-09-01: qty 20

## 2017-09-01 MED ORDER — DEXAMETHASONE SODIUM PHOSPHATE 10 MG/ML IJ SOLN
INTRAMUSCULAR | Status: DC | PRN
  Administered 2017-09-01: 5 mg via INTRAVENOUS

## 2017-09-01 MED ORDER — 0.9 % SODIUM CHLORIDE (POUR BTL) OPTIME
TOPICAL | Status: DC | PRN
  Administered 2017-09-01: 1000 mL

## 2017-09-01 MED ORDER — LIDOCAINE 2% (20 MG/ML) 5 ML SYRINGE
INTRAMUSCULAR | Status: DC | PRN
  Administered 2017-09-01: 60 mg via INTRAVENOUS

## 2017-09-01 MED ORDER — ACETAMINOPHEN 500 MG PO TABS
1000.0000 mg | ORAL_TABLET | ORAL | Status: AC
  Administered 2017-09-01: 1000 mg via ORAL
  Filled 2017-09-01: qty 2

## 2017-09-01 MED ORDER — KETAMINE HCL 10 MG/ML IJ SOLN
INTRAMUSCULAR | Status: AC
  Filled 2017-09-01: qty 1

## 2017-09-01 MED ORDER — LIDOCAINE 2% (20 MG/ML) 5 ML SYRINGE
INTRAMUSCULAR | Status: DC | PRN
  Administered 2017-09-01: 2.25 mL/h via INTRAVENOUS

## 2017-09-01 SURGICAL SUPPLY — 58 items
APPLICATOR COTTON TIP 6IN STRL (MISCELLANEOUS) ×3 IMPLANT
APPLIER CLIP 5 13 M/L LIGAMAX5 (MISCELLANEOUS)
APPLIER CLIP ROT 10 11.4 M/L (STAPLE)
APPLIER CLIP ROT 13.4 12 LRG (CLIP)
BLADE SURG 15 STRL LF DISP TIS (BLADE) ×1 IMPLANT
BLADE SURG 15 STRL SS (BLADE) ×2
CABLE HIGH FREQUENCY MONO STRZ (ELECTRODE) ×3 IMPLANT
CLIP APPLIE 5 13 M/L LIGAMAX5 (MISCELLANEOUS) IMPLANT
CLIP APPLIE ROT 10 11.4 M/L (STAPLE) IMPLANT
CLIP APPLIE ROT 13.4 12 LRG (CLIP) IMPLANT
DERMABOND ADVANCED (GAUZE/BANDAGES/DRESSINGS) ×4
DERMABOND ADVANCED .7 DNX12 (GAUZE/BANDAGES/DRESSINGS) ×2 IMPLANT
DEVICE SUT QUICK LOAD TK 5 (STAPLE) IMPLANT
DEVICE SUT TI-KNOT TK 5X26 (MISCELLANEOUS) IMPLANT
DEVICE SUTURE ENDOST 10MM (ENDOMECHANICALS) IMPLANT
DEVICE TI KNOT TK5 (MISCELLANEOUS)
DISSECTOR BLUNT TIP ENDO 5MM (MISCELLANEOUS) IMPLANT
ELECT REM PT RETURN 15FT ADLT (MISCELLANEOUS) ×3 IMPLANT
GAUZE SPONGE 4X4 12PLY STRL (GAUZE/BANDAGES/DRESSINGS) IMPLANT
GLOVE BIOGEL M 8.0 STRL (GLOVE) ×3 IMPLANT
GOWN STRL REUS W/TWL XL LVL3 (GOWN DISPOSABLE) ×12 IMPLANT
GRASPER SUT TROCAR 14GX15 (MISCELLANEOUS) ×3 IMPLANT
HANDLE STAPLE EGIA 4 XL (STAPLE) ×3 IMPLANT
HOVERMATT SINGLE USE (MISCELLANEOUS) ×3 IMPLANT
KIT BASIN OR (CUSTOM PROCEDURE TRAY) ×3 IMPLANT
MARKER SKIN DUAL TIP RULER LAB (MISCELLANEOUS) ×3 IMPLANT
NEEDLE SPNL 22GX3.5 QUINCKE BK (NEEDLE) ×3 IMPLANT
PACK UNIVERSAL I (CUSTOM PROCEDURE TRAY) ×3 IMPLANT
QUICK LOAD TK 5 (STAPLE)
RELOAD TRI 45 ART MED THCK BLK (STAPLE) ×6 IMPLANT
RELOAD TRI 45 ART MED THCK PUR (STAPLE) IMPLANT
RELOAD TRI 60 ART MED THCK BLK (STAPLE) ×9 IMPLANT
RELOAD TRI 60 ART MED THCK PUR (STAPLE) ×6 IMPLANT
SCISSORS LAP 5X45 EPIX DISP (ENDOMECHANICALS) IMPLANT
SET IRRIG TUBING LAPAROSCOPIC (IRRIGATION / IRRIGATOR) ×3 IMPLANT
SHEARS HARMONIC ACE PLUS 45CM (MISCELLANEOUS) ×3 IMPLANT
SLEEVE ADV FIXATION 5X100MM (TROCAR) ×6 IMPLANT
SLEEVE GASTRECTOMY 36FR VISIGI (MISCELLANEOUS) ×3 IMPLANT
SOLUTION ANTI FOG 6CC (MISCELLANEOUS) ×3 IMPLANT
SPONGE LAP 18X18 X RAY DECT (DISPOSABLE) ×3 IMPLANT
STAPLER VISISTAT 35W (STAPLE) ×3 IMPLANT
SUT MNCRL AB 4-0 PS2 18 (SUTURE) ×9 IMPLANT
SUT SURGIDAC NAB ES-9 0 48 120 (SUTURE) IMPLANT
SUT VIC AB 4-0 SH 18 (SUTURE) IMPLANT
SUT VICRYL 0 TIES 12 18 (SUTURE) ×3 IMPLANT
SYR 10ML ECCENTRIC (SYRINGE) ×3 IMPLANT
SYR 20CC LL (SYRINGE) ×3 IMPLANT
SYR 50ML LL SCALE MARK (SYRINGE) ×3 IMPLANT
TOWEL OR 17X26 10 PK STRL BLUE (TOWEL DISPOSABLE) ×6 IMPLANT
TOWEL OR NON WOVEN STRL DISP B (DISPOSABLE) ×3 IMPLANT
TROCAR ADV FIXATION 5X100MM (TROCAR) ×3 IMPLANT
TROCAR BLADELESS 15MM (ENDOMECHANICALS) ×3 IMPLANT
TROCAR BLADELESS OPT 5 100 (ENDOMECHANICALS) ×3 IMPLANT
TUBE CALIBRATION LAPBAND (TUBING) IMPLANT
TUBING CONNECTING 10 (TUBING) ×4 IMPLANT
TUBING CONNECTING 10' (TUBING) ×2
TUBING ENDO SMARTCAP (MISCELLANEOUS) ×3 IMPLANT
TUBING INSUF HEATED (TUBING) ×3 IMPLANT

## 2017-09-01 NOTE — Anesthesia Postprocedure Evaluation (Signed)
Anesthesia Post Note  Patient: Claudia Cole  Procedure(s) Performed: LAPAROSCOPIC GASTRIC SLEEVE RESECTION WITH UPPER ENDO (N/A Abdomen)     Patient location during evaluation: PACU Anesthesia Type: General Level of consciousness: awake and alert Pain management: pain level controlled Vital Signs Assessment: post-procedure vital signs reviewed and stable Respiratory status: spontaneous breathing, nonlabored ventilation, respiratory function stable and patient connected to nasal cannula oxygen Cardiovascular status: blood pressure returned to baseline and stable Postop Assessment: no apparent nausea or vomiting Anesthetic complications: no    Last Vitals:  Vitals:   09/01/17 1338 09/01/17 1537  BP: 132/69 (!) 148/91  Pulse: (!) 57 (!) 57  Resp: 16 16  Temp: (!) 36.4 C 36.5 C  SpO2: 99% 100%    Last Pain:  Vitals:   09/01/17 1537  TempSrc: Oral  PainSc:                  Yee Joss S

## 2017-09-01 NOTE — Progress Notes (Signed)
Discussed post op day goals with patient including ambulation, IS, diet progression, pain, and nausea control.  Questions answered. 

## 2017-09-01 NOTE — Anesthesia Preprocedure Evaluation (Signed)
Anesthesia Evaluation  Patient identified by MRN, date of birth, ID band Patient awake    Reviewed: Allergy & Precautions, H&P , NPO status , Patient's Chart, lab work & pertinent test results  Airway Mallampati: II   Neck ROM: full    Dental   Pulmonary asthma ,    breath sounds clear to auscultation       Cardiovascular negative cardio ROS   Rhythm:regular Rate:Normal     Neuro/Psych  Headaches, PSYCHIATRIC DISORDERS Depression    GI/Hepatic   Endo/Other  Morbid obesity  Renal/GU      Musculoskeletal   Abdominal   Peds  Hematology   Anesthesia Other Findings   Reproductive/Obstetrics                             Anesthesia Physical Anesthesia Plan  ASA: II  Anesthesia Plan: General   Post-op Pain Management:    Induction: Intravenous  PONV Risk Score and Plan: 3 and Ondansetron, Dexamethasone, Midazolam and Treatment may vary due to age or medical condition  Airway Management Planned: Oral ETT  Additional Equipment:   Intra-op Plan:   Post-operative Plan: Extubation in OR  Informed Consent: I have reviewed the patients History and Physical, chart, labs and discussed the procedure including the risks, benefits and alternatives for the proposed anesthesia with the patient or authorized representative who has indicated his/her understanding and acceptance.     Plan Discussed with: CRNA, Anesthesiologist and Surgeon  Anesthesia Plan Comments:         Anesthesia Quick Evaluation

## 2017-09-01 NOTE — Progress Notes (Signed)
Pt started ice chips and water at 1400. Tolerating well. Pt has been taught Incentive spirometer use, she is walking and voiding.

## 2017-09-01 NOTE — Interval H&P Note (Signed)
History and Physical Interval Note:  09/01/2017 7:09 AM  Claudia Cole  has presented today for surgery, with the diagnosis of MORBID OBESITY  The various methods of treatment have been discussed with the patient and family. After consideration of risks, benefits and other options for treatment, the patient has consented to  Procedure(s): LAPAROSCOPIC GASTRIC SLEEVE RESECTION WITH UPPER ENDO (N/A) as a surgical intervention .  The patient's history has been reviewed, patient examined, no change in status, stable for surgery.  I have reviewed the patient's chart and labs.  Questions were answered to the patient's satisfaction.     Valarie MerinoMatthew B Patton Swisher

## 2017-09-01 NOTE — Transfer of Care (Signed)
Immediate Anesthesia Transfer of Care Note  Patient: Claudia Cole  Procedure(s) Performed: LAPAROSCOPIC GASTRIC SLEEVE RESECTION WITH UPPER ENDO (N/A Abdomen)  Patient Location: PACU  Anesthesia Type:General  Level of Consciousness: sedated, drowsy and patient cooperative  Airway & Oxygen Therapy: Patient Spontanous Breathing and Patient connected to face mask oxygen  Post-op Assessment: Report given to RN and Post -op Vital signs reviewed and stable  Post vital signs: Reviewed and stable  Last Vitals:  Vitals Value Taken Time  BP 110/60 09/01/2017  9:48 AM  Temp    Pulse 72 09/01/2017  9:50 AM  Resp 16 09/01/2017  9:50 AM  SpO2 100 % 09/01/2017  9:50 AM  Vitals shown include unvalidated device data.  Last Pain:  Vitals:   09/01/17 0622  TempSrc:   PainSc: 0-No pain         Complications: No apparent anesthesia complications

## 2017-09-01 NOTE — Op Note (Signed)
Claudia Cole [id number] December 23, 1965 09/01/2017  Preoperative diagnosis: morbid obesity  Postoperative diagnosis: Same   Procedure: upper endoscopy   Surgeon: Mary SellaEric M. Berlyn Saylor M.D., FACS   Anesthesia: Gen.   Indications for procedure: 52 y.o. year old female undergoing Laparoscopic Gastric Sleeve Resection and an EGD was requested to evaluate the new gastric sleeve.   Description of procedure: After we have completed the sleeve resection, I scrubbed out and obtained the Olympus endoscope. I gently placed endoscope in the patient's oropharynx and gently glided it down the esophagus without any difficulty under direct visualization. Once I was in the gastric sleeve, I insufflated the stomach with air. I was able to cannulate and advanced the scope through the gastric sleeve. I was able to cannulate the duodenum with ease. Dr. Daphine DeutscherMartin had placed saline in the upper abdomen. Upon further insufflation of the gastric sleeve there was no evidence of bubbles. GE junction located at 38 cm.  Upon further inspection of the gastric sleeve, the mucosa appeared normal. There is no evidence of any mucosal abnormality. The sleeve was widely patent at the angularis. There was no evidence of bleeding. The gastric sleeve was decompressed. The scope was withdrawn. The patient tolerated this portion of the procedure well. Please see Dr Ermalene SearingMartin's operative note for details regarding the laparoscopic gastric sleeve resection.   Mary SellaEric M. Andrey CampanileWilson, MD, FACS  General, Bariatric, & Minimally Invasive Surgery  [facility], [place]

## 2017-09-01 NOTE — Op Note (Signed)
Surgeon: Wenda LowMatt Perris Conwell, MD, FACS  Asst:  Gaynelle AduEric Wilson, MD, FACS  Anes:  General endotracheal  Procedure: Laparoscopic sleeve gastrectomy and upper endoscopy  Diagnosis: Morbid obesity with prior lapband removal in [place]  Complications: none  EBL:   20 cc  Description of Procedure:  The patient was take to OR 2 and given general anesthesia.  The abdomen was prepped with Technicare and draped sterilely.  A timeout was performed.  Access to the abdomen was achieved with a 5 mm Optiview through the left upper quadrant.  Following insufflation, the state of the abdomen was found to be with adhesions from her prior lapband surgery.  The ViSiGi 36Fr tube was inserted to deflate the stomach and was pulled back into the esophagus.  No hiatal hernia was noted.   The pylorus was identified and we measured 6 cm back and marked the antrum.  At that point we began dissection to take down the greater curvature of the stomach using the Harmonic scalpel.  This dissection was taken all the way up to the left crus.  Posterior attachments of the stomach were also taken down.  There was a Endostitch suture presumeably from her lapband plication in [place].  This was debrided The ViSiGi tube was then passed into the antrum and suction applied so that it was snug along the lessor curvature.  The "crow's foot" or incisura was identified.  The sleeve gastrectomy was begun using the [employer] platform stapler beginning with a 4.5 cm black load followed by a 6 cm black load and then two firings with purple loads and then the remaining were black loads--all with TRS.    When the sleeve was complete the tube was taken off suction and insufflated briefly.  The tube was withdrawn.  Upper endoscopy was then performed by Dr. Andrey CampanileWilson and no bleeding or leaks were noted.     The specimen was extracted through the 15 trocar site.  Wounds were infiltrated with Exparel and closed with 4-0 monocryl.  10 cc were placed as a TAP block laterally.   0 vicryl was placed in the 15 mm trocar site  Exparel was used on the skin.    Matt B. Daphine DeutscherMartin, MD, [facility], [place] [phone]

## 2017-09-01 NOTE — Discharge Instructions (Signed)
° ° ° °GASTRIC BYPASS/SLEEVE ° Home Care Instructions ° ° These instructions are to help you care for yourself when you go home. ° °Call: If you have any problems. °• Call [phone] and ask for the surgeon on call °• If you need immediate help, come to the ER at [facility].  °• Tell the ER staff that you are a new post-op gastric bypass or gastric sleeve patient °  °Signs and symptoms to report: • Severe vomiting or nausea °o If you cannot keep down clear liquids for longer than 1 day, call your surgeon  °• Abdominal pain that does not get better after taking your pain medication °• Fever over 100.4° F with chills °• Heart beating over 100 beats a minute °• Shortness of breath at rest °• Chest pain °•  Redness, swelling, drainage, or foul odor at incision (surgical) sites °•  If your incisions open or pull apart °• Swelling or pain in calf (lower leg) °• Diarrhea (Loose bowel movements that happen often), frequent watery, uncontrolled bowel movements °• Constipation, (no bowel movements for 3 days) if this happens: Pick one °o Milk of Magnesia, 2 tablespoons by mouth, 3 times a day for 2 days if needed °o Stop taking Milk of Magnesia once you have a bowel movement °o Call your doctor if constipation continues °Or °o Miralax  (instead of Milk of Magnesia) following the label instructions °o Stop taking Miralax once you have a bowel movement °o Call your doctor if constipation continues °• Anything you think is not normal °  °Normal side effects after surgery: • Unable to sleep at night or unable to focus °• Irritability or moody °• Being tearful (crying) or depressed °These are common complaints, possibly related to your anesthesia medications that put you to sleep, stress of surgery, and change in lifestyle.  This usually goes away a few weeks after surgery.  If these feelings continue, call your primary care doctor. °  °Wound Care: You may have surgical glue, steri-strips, or staples over your incisions after  surgery °• Surgical glue:  Looks like a clear film over your incisions and will wear off a little at a time °• Steri-strips: Strips of tape over your incisions. You may notice a yellowish color on the skin under the steri-strips. This is used to make the   steri-strips stick better. Do not pull the steri-strips off - let them fall off °• Staples: Staples may be removed before you leave the hospital °o If you go home with staples, call [facility], [phone] at for an appointment with your surgeon’s nurse to have staples removed 10 days after surgery. °• Showering: You may shower two (2) days after your surgery unless your surgeon tells you differently °o Wash gently around incisions with warm soapy water, rinse well, and gently pat dry  °o No tub baths until staples are removed, steri-strips fall off or glue is gone.  °  °Medications: • Medications should be liquid or crushed if larger than the size of a dime °• Extended release pills (medication that release a little bit at a time through the day) should NOT be crushed or cut. (examples include XL, ER, DR, SR) °• Depending on the size and number of medications you take, you may need to space (take a few throughout the day)/change the time you take your medications so that you do not over-fill your pouch (smaller stomach) °• Make sure you follow-up with your primary care doctor to   make medication changes needed during rapid weight loss and life-style changes °• If you have diabetes, follow up with the doctor that orders your diabetes medication(s) within one week after surgery and check your blood sugar regularly. °• Do not drive while taking prescription pain medication  °• It is ok to take Tylenol by the bottle instructions with your pain medicine or instead of your pain medicine as needed.  DO NOT TAKE NSAIDS (EXAMPLES OF NSAIDS:  IBUPROFREN/ NAPROXEN)  °Diet:                    First 2 Weeks ° You will see the dietician t about two (2) weeks  after your surgery. The dietician will increase the types of foods you can eat if you are handling liquids well: °• If you have severe vomiting or nausea and cannot keep down clear liquids lasting longer than 1 day, call your surgeon @ ([phone]) °Protein Shake °• Drink at least 2 ounces of shake 5-6 times per day °• Each serving of protein shakes (usually 8 - 12 ounces) should have: °o 15 grams of protein  °o And no more than 5 grams of carbohydrate  °• Goal for protein each day: °o Men = 80 grams per day °o Women = 60 grams per day °• Protein powder may be added to fluids such as non-fat milk or Lactaid milk or unsweetened Soy/Almond milk (limit to 35 grams added protein powder per serving) ° °Hydration °• Slowly increase the amount of water and other clear liquids as tolerated (See Acceptable Fluids) °• Slowly increase the amount of protein shake as tolerated  °•  Sip fluids slowly and throughout the day.  Do not use straws. °• May use sugar substitutes in small amounts (no more than 6 - 8 packets per day; i.e. Splenda) ° °Fluid Goal °• The first goal is to drink at least 8 ounces of protein shake/drink per day (or as directed by the nutritionist); some examples of protein shakes are Syntrax Nectar, Adkins Advantage, EAS Edge HP, and Unjury. See handout from pre-op Bariatric Education Class: °o Slowly increase the amount of protein shake you drink as tolerated °o You may find it easier to slowly sip shakes throughout the day °o It is important to get your proteins in first °• Your fluid goal is to drink 64 - 100 ounces of fluid daily °o It may take a few weeks to build up to this °• 32 oz (or more) should be clear liquids  °And  °• 32 oz (or more) should be full liquids (see below for examples) °• Liquids should not contain sugar, caffeine, or carbonation ° °Clear Liquids: °• Water or Sugar-free flavored water (i.e. Fruit H2O, Propel) °• Decaffeinated coffee or tea (sugar-free) °• Crystal Lite, Wyler’s Lite,  Minute Maid Lite °• Sugar-free Jell-O °• Bouillon or broth °• Sugar-free Popsicle:   *Less than 20 calories each; Limit 1 per day ° °Full Liquids: °Protein Shakes/Drinks + 2 choices per day of other full liquids °• Full liquids must be: °o No More Than 15 grams of Carbs per serving  °o No More Than 3 grams of Fat per serving °• Strained low-fat cream soup (except Cream of Potato or Tomato) °• Non-Fat milk °• Fat-free Lactaid Milk °• Unsweetened Soy Or Unsweetened Almond Milk °• Low Sugar yogurt (Dannon Lite & Fit, Greek yogurt; Oikos Triple Zero; Chobani Simply 100; Yoplait 100 calorie Greek - No Fruit on the Bottom) ° °  °Vitamins   and Minerals • Start 1 day after surgery unless otherwise directed by your surgeon °• 2 Chewable Bariatric Specific Multivitamin / Multimineral Supplement with iron (Example: Bariatric Advantage Multi EA) °• Chewable Calcium with Vitamin D-3 °(Example: 3 Chewable Calcium Plus 600 with Vitamin D-3) °o Take 500 mg three (3) times a day for a total of 1500 mg each day °o Do not take all 3 doses of calcium at one time as it may cause constipation, and you can only absorb 500 mg  at a time  °o Do not mix multivitamins containing iron with calcium supplements; take 2 hours apart °• Menstruating women and those with a history of anemia (a blood disease that causes weakness) may need extra iron °o Talk with your doctor to see if you need more iron °• Do not stop taking or change any vitamins or minerals until you talk to your dietitian or surgeon °• Your Dietitian and/or surgeon must approve all vitamin and mineral supplements °  °Activity and Exercise: Limit your physical activity as instructed by your doctor.  It is important to continue walking at home.  During this time, use these guidelines: °• Do not lift anything greater than ten (10) pounds for at least two (2) weeks °• Do not go back to work or drive until your surgeon says you can °• You may have sex when you feel comfortable  °o It is  VERY important for female patients to use a reliable birth control method; fertility often increases after surgery  °o All hormonal birth control will be ineffective for 30 days after surgery due to medications given during surgery a barrier method must be used. °o Do not get pregnant for at least 18 months °• Start exercising as soon as your doctor tells you that you can °o Make sure your doctor approves any physical activity °• Start with a simple walking program °• Walk 5-15 minutes each day, 7 days per week.  °• Slowly increase until you are walking 30-45 minutes per day °Consider joining our BELT program. [phone] or email [email or web address] °  °Special Instructions Things to remember: °• Use your CPAP when sleeping if this applies to you ° °• [facility] has two free Bariatric Surgery Support Groups that meet monthly °o The 3rd Thursday of each month, 6 pm, [facility]  °o The 2nd Friday of each month, 11:45 am in the private dining room in the basement of [facility] °• It is very important to keep all follow up appointments with your surgeon, dietitian, primary care physician, and behavioral health practitioner °• Routine follow up schedule with your surgeon include appointments at 2-3 weeks, 6-8 weeks, 6 months, and 1 year at a minimum.  Your surgeon may request to see you more often.   °o After the first year, please follow up with your bariatric surgeon and dietitian at least once a year in order to maintain best weight loss results °[facility]: [phone] °[facility]: [phone] °Bariatric Nurse Coordinator: [phone] °  °   Reviewed and Endorsed  °by [facility] Patient Education Committee, June, 2016 °Edits Approved: Aug, 2018 ° ° ° °

## 2017-09-01 NOTE — Anesthesia Procedure Notes (Signed)
Procedure Name: Intubation Performed by: Gean Maidens, CRNA Pre-anesthesia Checklist: Patient identified, Emergency Drugs available, Suction available, Patient being monitored and Timeout performed Patient Re-evaluated:Patient Re-evaluated prior to induction Oxygen Delivery Method: Circle system utilized Induction Type: IV induction Ventilation: Mask ventilation without difficulty Laryngoscope Size: Mac and 4 Grade View: Grade I Tube type: Oral Tube size: 7.0 mm Number of attempts: 1 Airway Equipment and Method: Stylet Placement Confirmation: ETT inserted through vocal cords under direct vision,  positive ETCO2,  CO2 detector and breath sounds checked- equal and bilateral Secured at: 21 cm Tube secured with: Tape Dental Injury: Teeth and Oropharynx as per pre-operative assessment

## 2017-09-02 LAB — CBC WITH DIFFERENTIAL/PLATELET
BASOS ABS: 0 10*3/uL (ref 0.0–0.1)
Basophils Relative: 0 %
EOS PCT: 0 %
Eosinophils Absolute: 0 10*3/uL (ref 0.0–0.7)
HEMATOCRIT: 41.2 % (ref 36.0–46.0)
Hemoglobin: 14.1 g/dL (ref 12.0–15.0)
LYMPHS ABS: 1.6 10*3/uL (ref 0.7–4.0)
LYMPHS PCT: 12 %
MCH: 31.8 pg (ref 26.0–34.0)
MCHC: 34.2 g/dL (ref 30.0–36.0)
MCV: 92.8 fL (ref 78.0–100.0)
MONO ABS: 0.7 10*3/uL (ref 0.1–1.0)
MONOS PCT: 5 %
NEUTROS ABS: 11.4 10*3/uL — AB (ref 1.7–7.7)
Neutrophils Relative %: 83 %
PLATELETS: 293 10*3/uL (ref 150–400)
RBC: 4.44 MIL/uL (ref 3.87–5.11)
RDW: 12.9 % (ref 11.5–15.5)
WBC: 13.8 10*3/uL — ABNORMAL HIGH (ref 4.0–10.5)

## 2017-09-02 NOTE — Discharge Summary (Signed)
Physician Discharge Summary  Patient ID: Claudia Cole MRN: [id number] DOB/AGE: 1966-03-11 52 y.o.  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  She was begun on liquids and advanced.  Ready for discharge on Wednesday.    Consults: none  Significant Diagnostic Studies: none    Discharge Exam: Blood pressure (!) 141/77, pulse 63, temperature 97.8 F (36.6 C), temperature source Oral, resp. rate 14, height 4' 11.5" (1.511 m), weight 110 kg (242 lb 8 oz), SpO2 97 %. Incisions not hurting probably from block with Exparel  Disposition: Discharge disposition: 01-Home or Self Care       Discharge Instructions    Ambulate hourly while awake   Complete by:  As directed    Call MD for:  difficulty breathing, headache or visual disturbances   Complete by:  As directed    Call MD for:  persistant dizziness or light-headedness   Complete by:  As directed    Call MD for:  persistant nausea and vomiting   Complete by:  As directed    Call MD for:  redness, tenderness, or signs of infection (pain, swelling, redness, odor or green/yellow discharge around incision site)   Complete by:  As directed    Call MD for:  severe uncontrolled pain   Complete by:  As directed    Call MD for:  temperature >101 F   Complete by:  As directed    Diet bariatric full liquid   Complete by:  As directed    Incentive spirometry   Complete by:  As directed    Perform hourly while awake     Allergies as of 09/02/2017      Reactions   Keflex [cephalexin] Rash   In certain places -between legs,sides, at buttocks      Medication List    STOP taking these  medications   meloxicam 7.5 MG tablet Commonly known as:  MOBIC     TAKE these medications   albuterol 108 (90 Base) MCG/ACT inhaler Commonly known as:  PROVENTIL HFA;VENTOLIN HFA Inhale 1 puff into the lungs every 6 (six) hours as needed.   BARIATRIC FUSION PO Take 1 tablet by mouth 2 (two) times daily.   BREO ELLIPTA 200-25 MCG/INH Aepb Generic drug:  fluticasone furoate-vilanterol Inhale 1 puff into the lungs daily.   buPROPion 300 MG 24 hr tablet Commonly known as:  WELLBUTRIN XL Take 300 mg by mouth every morning.   CALCIUM CITRATE CHEWY BITE PO Take 1 each by mouth daily.   levocetirizine 5 MG tablet Commonly known as:  XYZAL Take 5 mg by mouth every evening.   montelukast 10 MG tablet Commonly known as:  SINGULAIR Take 1 tablet by mouth every evening. Around 4p   zolpidem 12.5 MG CR tablet Commonly known as:  AMBIEN CR Take 1 tablet by mouth at bedtime.      Follow-up Information    [facility]. Go on 09/17/2017.   Specialty:  General Surgery Why:  at 9:45am with Dr. Wenda LowMatt Braylin Xu Contact information: [address] [place] [address] [phone]        [facility], [facility] .   Specialty:  General Surgery Contact information: [address] [place] [address] [phone]           Signed: Valarie Merino 09/02/2017, 9:59 AM

## 2017-09-02 NOTE — Progress Notes (Signed)
Discharge instructions reviewed with patient. All questions answered. Patient wheeled to vehicle with belongings by nurse tech. 

## 2017-09-02 NOTE — Progress Notes (Signed)
Patient alert and oriented, pain is controlled. Patient is tolerating fluids, advanced to protein shake today, patient is tolerating well.  Reviewed Gastric sleeve discharge instructions with patient and patient is able to articulate understanding.  Provided information on BELT program, Support Group and [facility] outpatient pharmacy. All questions answered, will continue to monitor.   Total fluid intake 720 Call back for hydration protocol Monday

## 2017-09-02 NOTE — Plan of Care (Signed)
Nutrition Education Note  Received consult for diet education per DROP protocol.   Discussed 2 week post op diet with pt. Emphasized that liquids must be non carbonated, non caffeinated, and sugar free. Fluid goals discussed. Pt to follow up with outpatient bariatric RD for further diet progression after 2 weeks. Multivitamins and minerals also reviewed. Teach back method used, pt expressed understanding, expect good compliance.   Diet: First 2 Weeks  You will see the nutritionist about two (2) weeks after your surgery. The nutritionist will increase the types of foods you can eat if you are handling liquids well:  If you have severe vomiting or nausea and cannot handle clear liquids lasting longer than 1 day, call your surgeon  Protein Shake  Drink at least 2 ounces of shake 5-6 times per day  Each serving of protein shakes (usually 8 - 12 ounces) should have a minimum of:  15 grams of protein  And no more than 5 grams of carbohydrate  Goal for protein each day:  Men = 80 grams per day  Women = 60 grams per day  Protein powder may be added to fluids such as non-fat milk or Lactaid milk or Soy milk (limit to 35 grams added protein powder per serving)   Hydration  Slowly increase the amount of water and other clear liquids as tolerated (See Acceptable Fluids)  Slowly increase the amount of protein shake as tolerated  Sip fluids slowly and throughout the day  May use sugar substitutes in small amounts (no more than 6 - 8 packets per day; i.e. Splenda)   Fluid Goal  The first goal is to drink at least 8 ounces of protein shake/drink per day (or as directed by the nutritionist); some examples of protein shakes are Premier Protein, Syntrax Nectar, Adkins Advantage, EAS Edge HP, and Unjury. See handout from pre-op Bariatric Education Class:  Slowly increase the amount of protein shake you drink as tolerated  You may find it easier to slowly sip shakes throughout the day  It is important to  get your proteins in first  Your fluid goal is to drink 64 - 100 ounces of fluid daily  It may take a few weeks to build up to this  32 oz (or more) should be clear liquids  And  32 oz (or more) should be full liquids (see below for examples)  Liquids should not contain sugar, caffeine, or carbonation   Clear Liquids:  Water or Sugar-free flavored water (i.e. Fruit H2O, Propel)  Decaffeinated coffee or tea (sugar-free)  Crystal Lite, Wyler?s Lite, Minute Maid Lite  Sugar-free Jell-O  Bouillon or broth  Sugar-free Popsicle: *Less than 20 calories each; Limit 1 per day   Full Liquids:  Protein Shakes/Drinks + 2 choices per day of other full liquids  Full liquids must be:  No More Than 12 grams of Carbs per serving  No More Than 3 grams of Fat per serving  Strained low-fat cream soup  Non-Fat milk  Fat-free Lactaid Milk  Sugar-free yogurt (Dannon Lite & Fit, Greek yogurt, Oikos Zero)   Claudia Nicklaus, MS, RD, LDN [place] Inpatient Clinical Dietitian Pager: [phone] After Hours Pager: [phone]   

## 2017-09-02 NOTE — Progress Notes (Signed)
Patient alert and oriented, Post op day 1.  Provided support and encouragement.  Encouraged pulmonary toilet, ambulation and small sips of liquids.  Completed 12 ounces of bari clear fluid and 8 ouncesof protein shake.  All questions answered.  Will continue to monitor.

## 2017-09-04 ENCOUNTER — Telehealth (HOSPITAL_COMMUNITY): Payer: Self-pay

## 2017-09-04 NOTE — Telephone Encounter (Signed)
Called patient to follow up post bariatric surgery per hydration protocol.  No answer at this time, voicemail left including contact information.  Patient called to discuss post bariatric surgery follow up questions.  See below:   1.  Tell me about your pain and pain management?  2.  Let's talk about fluid intake.  How much total fluid are you taking in?  3.  How much protein have you taken in the last 2 days?  4.  Have you had nausea?  Tell me about when have experienced nausea and what you did to help?  5.  Has the frequency or color changed with your urine?  6.  Tell me what your incisions look like?  7.  Have you been passing gas? BM?  8.  If a problem or question were to arise who would you call?  Do you know contact numbers for [facility], [facility], and [facility]?  9.  How has the walking going?  10.  How are your vitamins and calcium going?  How are you taking them?

## 2017-09-07 ENCOUNTER — Telehealth (HOSPITAL_COMMUNITY): Payer: Self-pay | Admitting: *Deleted

## 2017-09-15 ENCOUNTER — Encounter: Payer: 59 | Attending: Surgery | Admitting: Registered"

## 2017-09-15 DIAGNOSIS — Z713 Dietary counseling and surveillance: Secondary | ICD-10-CM | POA: Diagnosis present

## 2017-09-15 DIAGNOSIS — Z6841 Body Mass Index (BMI) 40.0 and over, adult: Secondary | ICD-10-CM | POA: Insufficient documentation

## 2017-09-15 DIAGNOSIS — E669 Obesity, unspecified: Secondary | ICD-10-CM

## 2017-09-18 ENCOUNTER — Telehealth: Payer: Self-pay | Admitting: Registered"

## 2017-09-18 NOTE — Telephone Encounter (Signed)
RD called pt to verify fluid intake once restarting soft, solid proteins 2 week post-bariatric surgery.   Daily Fluid intake: 40+ ounces Daily Protein intake: 60grams  Concerns/issues: none stated. Pt was instructed to contact RD if unable to drink at least 40 ounces a day.

## 2017-09-18 NOTE — Progress Notes (Signed)
Bariatric Class:  Appt start time: 1530 end time:  1630.  2 Week Post-Operative Nutrition Class  Patient was seen on 09/15/2017 for Post[facility]ative [facility]   Surgery date: 09/01/2017 Surgery type: Sleeve Start weight at [facility]: 256.11.2 Weight today: 235.4 Weight change: 20.7 lbs loss  Pt reports drinking 25-40 ounces of fluid and 67 grams of protein. Pt reports some gas.   TANITA  BODY COMP RESULTS  09/15/2017   BMI (kg/m^2) 46.7   Fat Mass (lbs) 120.8   Fat Free Mass (lbs) 114.6   Total Body Water (lbs) 83.6   The following the learning objectives were met by the patient during this course:  Identifies Phase 3A (Soft, High Proteins) Dietary Goals and will begin from 2 weeks post-operatively to 2 months post-operatively  Identifies appropriate sources of fluids and proteins   States protein recommendations and appropriate sources post-operatively  Identifies the need for appropriate texture modifications, mastication, and bite sizes when consuming solids  Identifies appropriate multivitamin and calcium sources post-operatively  Describes the need for physical activity post-operatively and will follow MD recommendations  States when to call healthcare provider regarding medication questions or post-operative complications  Handouts given during class include:  Phase 3A: Soft, High Protein Diet Handout  Follow-Up Plan: Patient will follow-up at [facility] in 6 weeks for 2 month post-op nutrition visit for diet advancement per MD.    

## 2017-10-26 ENCOUNTER — Ambulatory Visit: Payer: Self-pay | Admitting: Registered"

## 2017-11-12 ENCOUNTER — Encounter: Payer: Self-pay | Admitting: Registered"

## 2017-11-12 ENCOUNTER — Encounter: Payer: 59 | Attending: Surgery | Admitting: Registered"

## 2017-11-12 DIAGNOSIS — Z6841 Body Mass Index (BMI) 40.0 and over, adult: Secondary | ICD-10-CM | POA: Diagnosis not present

## 2017-11-12 DIAGNOSIS — Z713 Dietary counseling and surveillance: Secondary | ICD-10-CM | POA: Diagnosis not present

## 2017-11-12 DIAGNOSIS — E669 Obesity, unspecified: Secondary | ICD-10-CM

## 2017-11-12 NOTE — Patient Instructions (Addendum)
-   Try to have food for breakfast instead of protein shake.   - Track protein intake and fluid intake. Aim for at least 60 grams of protein and 64 ounces of fluid a day.   - Take bariatric multivitamin.   - Take 3 calcium supplements to equal about 1500 mg a day.   - Take multivitamins and calcium at least 2 hours apart from one another.   - Aim to increase physical activity 20-30 min, at least 3 days a week.

## 2017-11-12 NOTE — Progress Notes (Signed)
Follow-up visit:  8 Weeks Post-Operative Sleeve Gastrectomy Surgery  Medical Nutrition Therapy:  Appt start time: 2:47 end time:  3:34.  Primary concerns today: Post-operative Bariatric Surgery Nutrition Management.  Non scale victories: wearing smaller sizes, getting off the couch easier  Surgery date: 09/01/2017 Surgery type: Sleeve [facility]: 256.11.2 Weight today: 215.4 Weight change: 20 lbs loss from 235.4 (09/15/2017) Total weight lost: 40.7 lbs Weight loss goal: "to help me lose weight and keep the weight off"   TANITA  BODY COMP RESULTS  09/15/2017 11/12/2017   BMI (kg/m^2) 46.7 42.8   Fat Mass (lbs) 120.8 101.4   Fat Free Mass (lbs) 114.6 114.0   Total Body Water (lbs) 83.6 82.2   Pt states she had a fall and twisted her knee recently. Pt states she is drinking about 45 ounces of water and cannot drink more than that. Pt states she does not eat seafood. Pt states she does not know what app to use which is why she does not track her food; RD helped pt become more acquainted with [employer] in appt. Pt states she uses her head to track her food. Pt states she prefers to not sure a computer and does not want to order vitamins online; RD provided phone numbers to access bariatric multivitamin companies to place order. Pt states she does not have the finances to buy bariatric multivitamins which is why she is taking OTC gummies. Pt states she works 12 hour shifts.   Preferred Learning Style:   No preference indicated   Learning Readiness:   Ready  Change in progress  24-hr recall: B (AM): protein shake (30g) or 1-2 boiled eggs (6-12g) Snk (AM): none  L (PM): burger patty with baked beans  Snk (PM): none  D (PM): protein shake (30g) or 1-2 low-fat hot dogs (6g) Snk (PM): none  Fluid intake: G2 (32 oz), protein shakes; ~48 ounces Estimated total protein intake: 40-60 grams  Medications: See list Supplementation: 2 Gummy vitamins, multivitamin, 2 calcium    Using straws: no Drinking while eating: no Having you been chewing well: yes Chewing/swallowing difficulties: no Changes in vision: no Changes to mood/headaches: no, no Hair loss/Changes to skin/Changes to nails: no, no, no Any difficulty focusing or concentrating: no Sweating: no Dizziness/Lightheaded: no Palpitations: no  Carbonated beverages: 2 sparkling waters N/V/D/C/GAS: no, no, no, sometimes, no Abdominal Pain: no Dumping syndrome: no Last Lap-Band fill: N/A  Recent physical activity: none stated  Progress Towards Goal(s):  In progress.  Handouts given during visit include:  Vitamin and Mineral Recommendations   Nutritional Diagnosis:  Watson-3.3 Overweight/obesity related to past poor dietary habits and physical inactivity as evidenced by patient w/ recent sleeve gastrectomy surgery following dietary guidelines for continued weight loss.     Intervention:  Nutrition education and counseling. Pt was educated and counseled on the importance of tracking protein intake, replacing shakes with meals, taking recommended bariatric multivitamin and calcium supplements, and physical activity. Pt was in agreement with goals listed.  Goals: - Try to have food for breakfast instead of protein shake.  - Track protein intake and fluid intake. Aim for at least 60 grams of protein and 64 ounces of fluid a day.  - Take bariatric multivitamin.  - Take 3 calcium supplements to equal about 1500 mg a day.  - Take multivitamins and calcium at least 2 hours apart from one another.  - Aim to increase physical activity 20-30 min, at least 3 days a week.  Teaching Method Utilized:  Visual Auditory Hands on  Barriers to learning/adherence to lifestyle change: contemplative stage of change; financial restraints  Demonstrated degree of understanding via:  Teach Back   Monitoring/Evaluation:  Dietary intake, exercise, lap band fills, and body weight. Follow up in 3 weeks for 3 month post-op  visit.

## 2017-11-24 ENCOUNTER — Other Ambulatory Visit: Payer: Self-pay | Admitting: Physician Assistant

## 2017-11-24 DIAGNOSIS — Z1231 Encounter for screening mammogram for malignant neoplasm of breast: Secondary | ICD-10-CM

## 2017-12-07 ENCOUNTER — Ambulatory Visit: Payer: Self-pay | Admitting: Skilled Nursing Facility1

## 2017-12-21 ENCOUNTER — Ambulatory Visit: Payer: Self-pay

## 2018-01-05 ENCOUNTER — Ambulatory Visit
Admission: RE | Admit: 2018-01-05 | Discharge: 2018-01-05 | Disposition: A | Discharge status: Home / Self Care | Payer: 59 | Source: Ambulatory Visit | Attending: Physician Assistant | Admitting: Physician Assistant

## 2018-01-05 DIAGNOSIS — Z1231 Encounter for screening mammogram for malignant neoplasm of breast: Secondary | ICD-10-CM

## 2018-04-25 IMAGING — RF DG UGI W/ KUB
9 of 13 series · 12 of 24 positions shown · non-contrast
Comparison: None

CLINICAL DATA: Preop gastric sleeve

EXAM:
UPPER GI SERIES WITH KUB
TECHNIQUE: After obtaining a scout radiograph a routine upper GI series was
performed using thin barium
FLUOROSCOPY TIME:  Fluoroscopy Time:  1.3 minutes
Radiation Exposure Index (if provided by the fluoroscopic device):
42.1 mGy
Number of Acquired Spot Images: 0

[Series 2: cp_standard · 0.52mm/px · 2 of 129 frames shown (1 of 9)]
[frame 1/129]
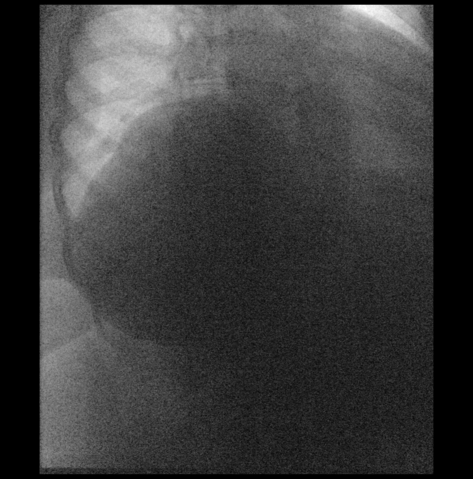
[frame 110/129]
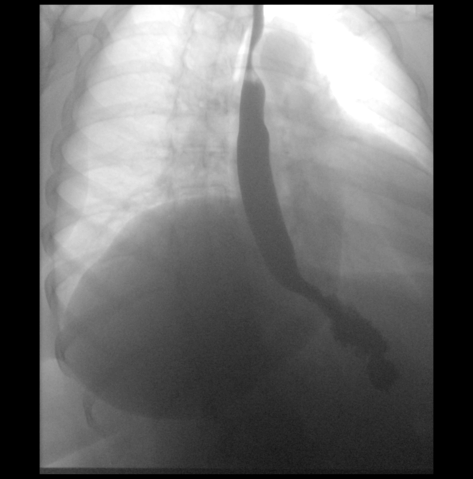

[Series 3: cp_standard · 0.52mm/px · 1 of 64 frames shown (2 of 9)]
[frame 31/64]
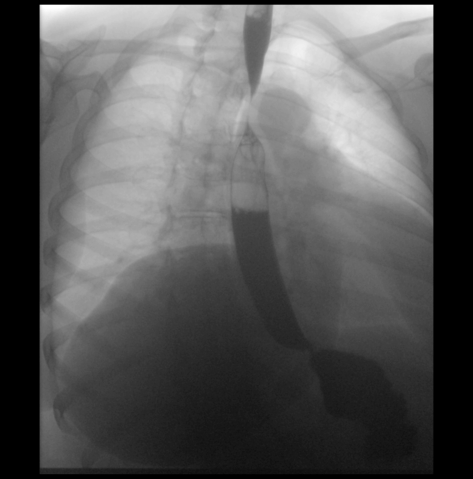

[Series 4: cp_standard · 0.51mm/px · 2 of 71 frames shown (3 of 9)]
[frame 11/71]
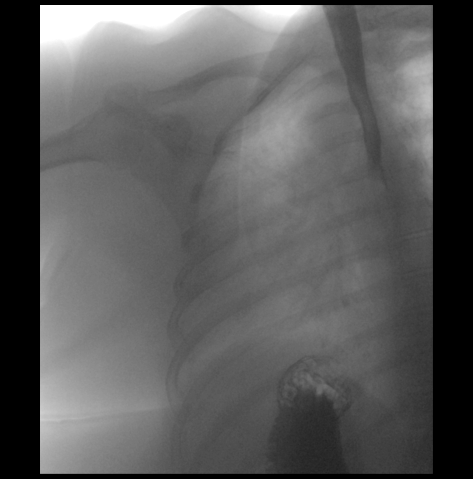
[frame 36/71]
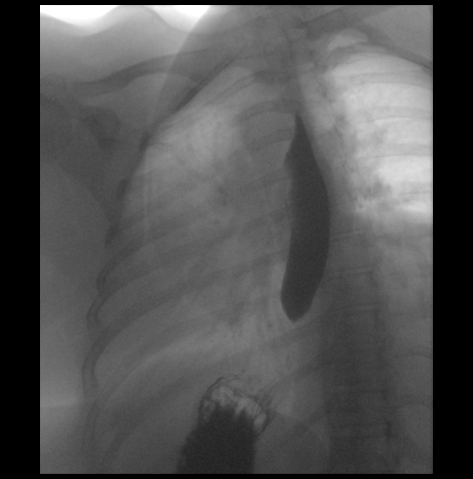

[Series 5: cp_standard · 0.51mm/px · 2 of 73 frames shown (4 of 9)]
[frame 11/73]
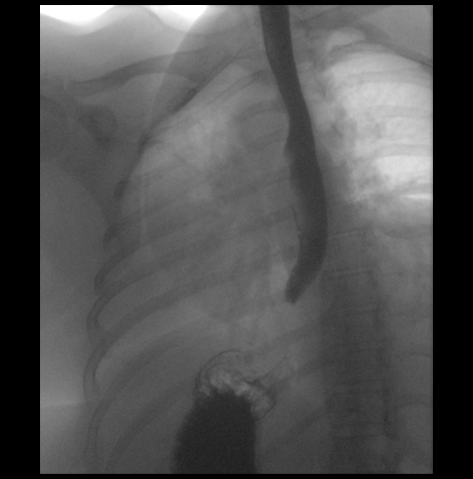
[frame 63/73]
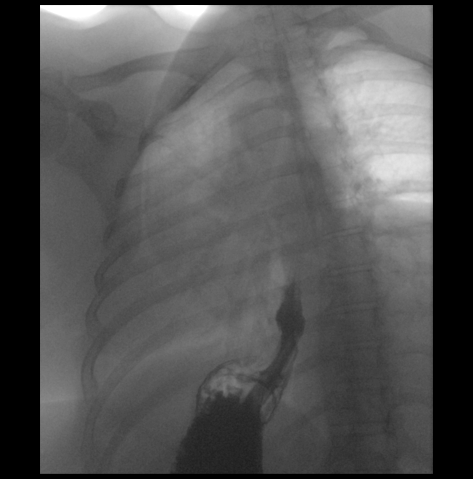

[Series 7: cp_standard · 0.18mm/px · 1 of 1 slices shown (5 of 9)]
[im 1/1]
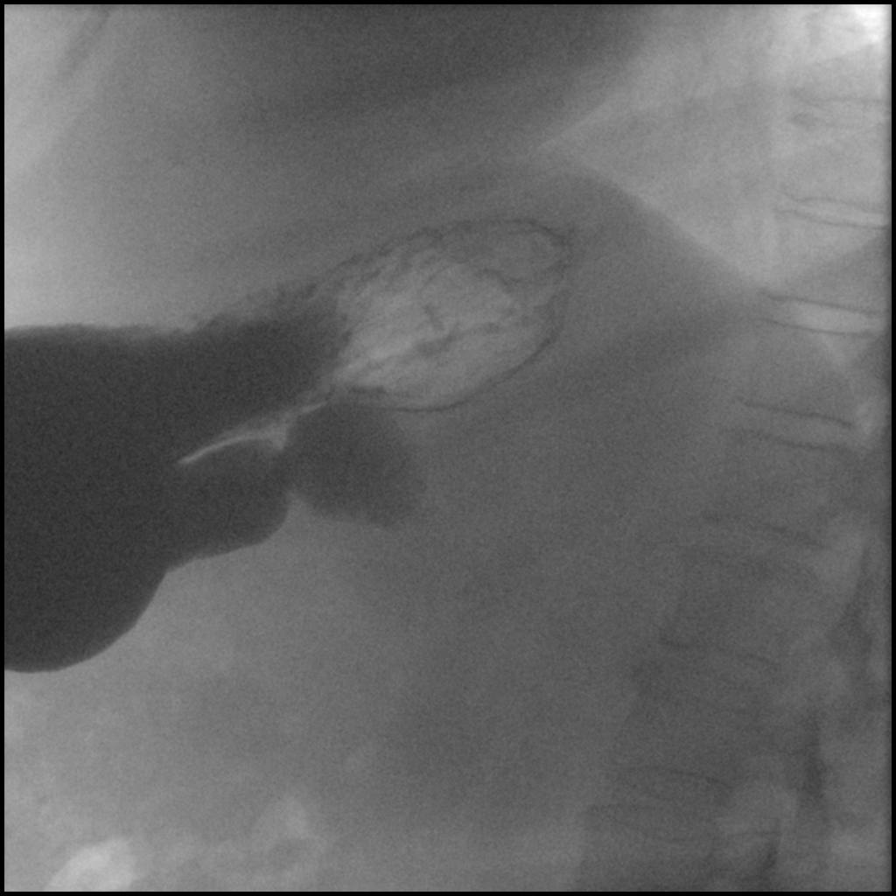

[Series 10: cp_standard · 0.18mm/px · 1 of 1 slices shown (6 of 9)]
[im 1/1]
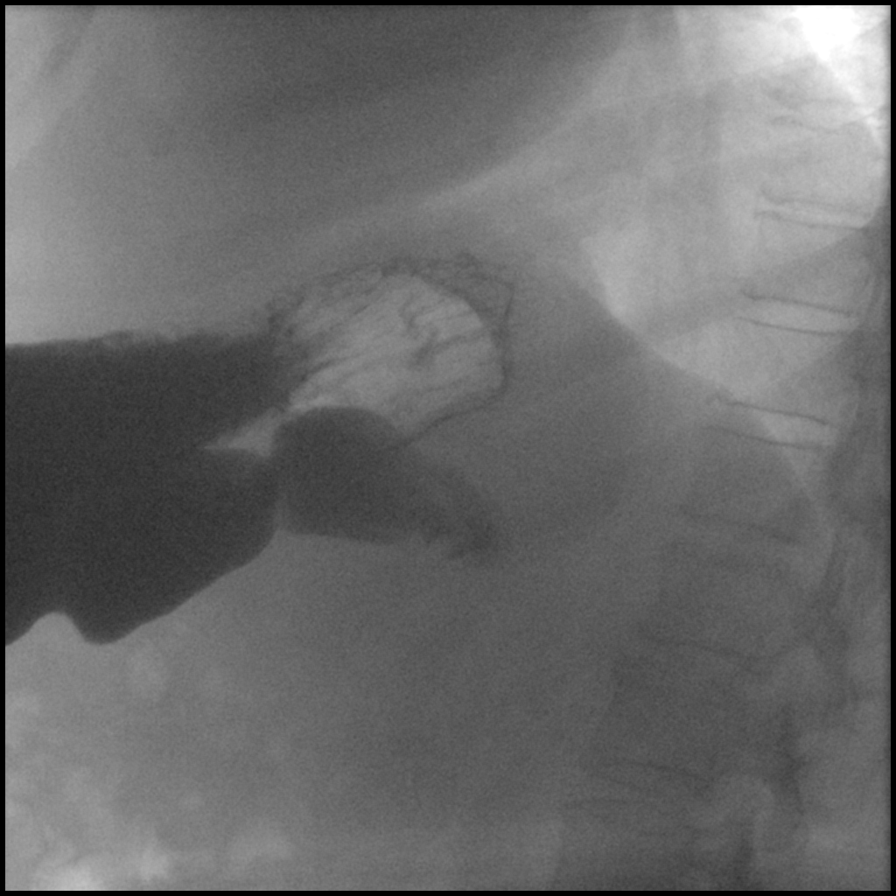

[Series 11: cp_standard · 0.36mm/px · 1 of 65 frames shown (7 of 9)]
[frame 33/65]
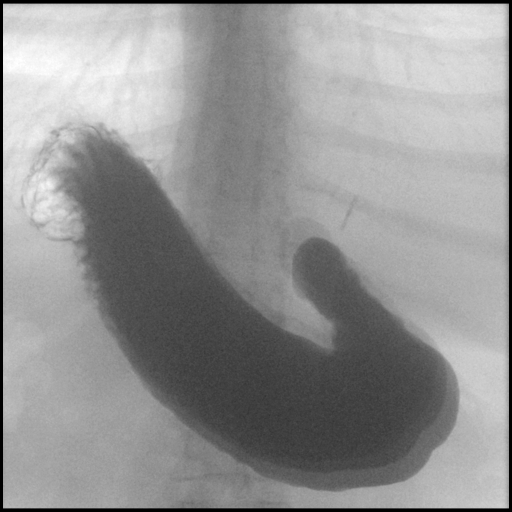

[Series 12: cp_standard · 0.18mm/px · 1 of 1 slices shown (8 of 9)]
[im 1/1]
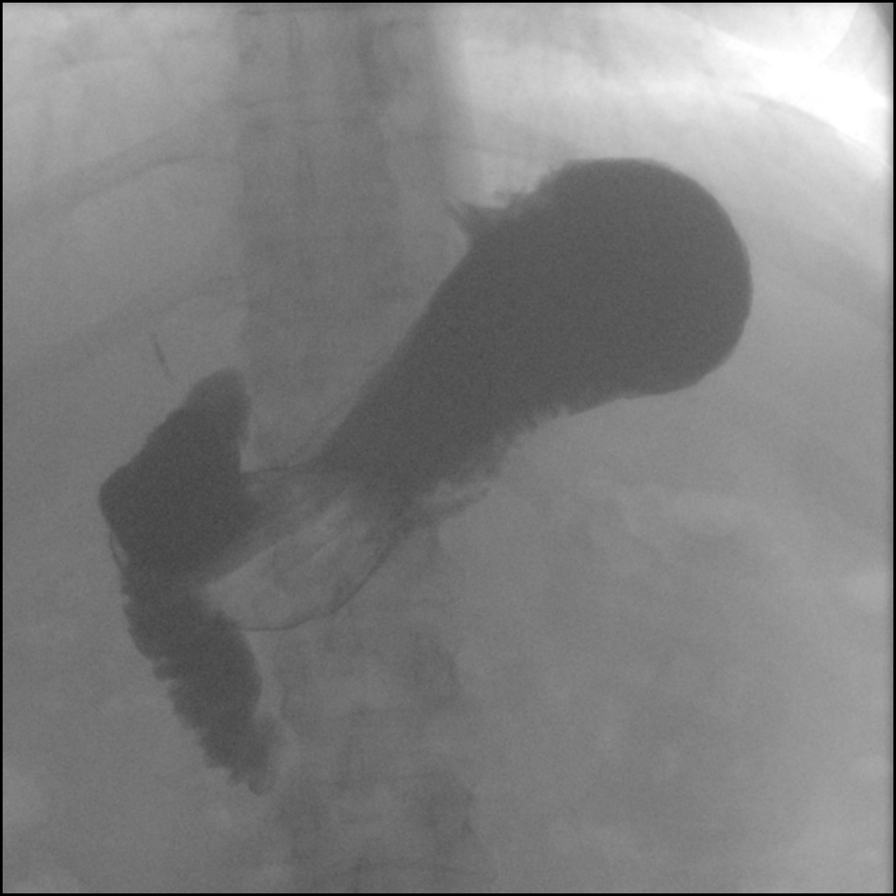

[Series 14: cp_standard · 0.18mm/px · 1 of 1 slices shown (9 of 9)]
[im 1/1]
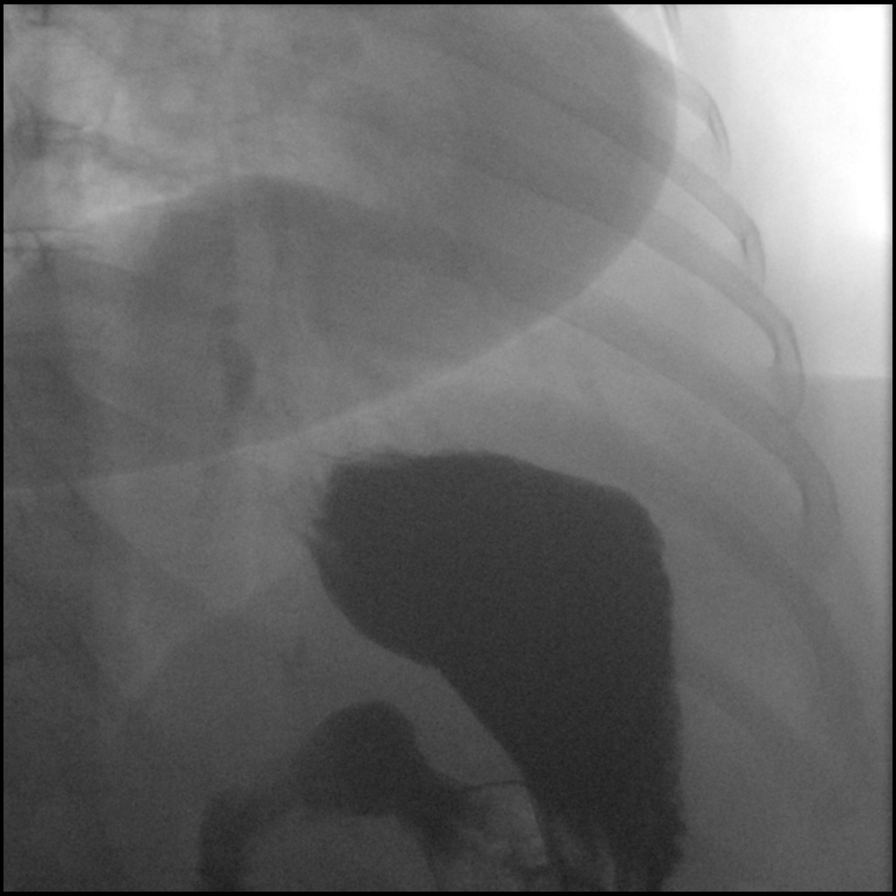

[12 of 24 positions shown; findings below may reference images not displayed]

FINDINGS: Scout image demonstrates changes of prior cholecystectomy.
Nonobstructive bowel gas pattern.

Fluoroscopic evaluation of swallowing demonstrates normal esophageal
motility. No fixed stricture, fold thickening or mass. Small amount
of reflux noted with the water siphon maneuver. Stomach, duodenal
bulb and duodenal sweep are normal. No ulceration, fold thickening
or mass.
IMPRESSION: Small amount of gastroesophageal reflux. Otherwise unremarkable
study.

## 2018-11-03 ENCOUNTER — Other Ambulatory Visit: Payer: Self-pay

## 2018-11-03 DIAGNOSIS — Z1231 Encounter for screening mammogram for malignant neoplasm of breast: Secondary | ICD-10-CM

## 2019-01-07 ENCOUNTER — Other Ambulatory Visit: Payer: Self-pay

## 2019-01-07 ENCOUNTER — Ambulatory Visit
Admission: RE | Admit: 2019-01-07 | Discharge: 2019-01-07 | Disposition: A | Discharge status: Home / Self Care | Payer: 59 | Source: Ambulatory Visit | Attending: Physician Assistant | Admitting: Physician Assistant

## 2019-01-07 DIAGNOSIS — Z1231 Encounter for screening mammogram for malignant neoplasm of breast: Secondary | ICD-10-CM

## 2019-03-24 ENCOUNTER — Encounter (HOSPITAL_COMMUNITY): Payer: Self-pay

## 2019-12-14 ENCOUNTER — Other Ambulatory Visit: Payer: Self-pay | Admitting: Physician Assistant

## 2019-12-14 DIAGNOSIS — Z1231 Encounter for screening mammogram for malignant neoplasm of breast: Secondary | ICD-10-CM

## 2020-01-09 ENCOUNTER — Ambulatory Visit
Admission: RE | Admit: 2020-01-09 | Discharge: 2020-01-09 | Disposition: A | Discharge status: Home / Self Care | Payer: 59 | Source: Ambulatory Visit | Attending: Physician Assistant | Admitting: Physician Assistant

## 2020-01-09 ENCOUNTER — Other Ambulatory Visit: Payer: Self-pay

## 2020-01-09 DIAGNOSIS — Z1231 Encounter for screening mammogram for malignant neoplasm of breast: Secondary | ICD-10-CM

## 2020-03-23 ENCOUNTER — Encounter (HOSPITAL_COMMUNITY): Payer: Self-pay

## 2020-12-03 ENCOUNTER — Other Ambulatory Visit: Payer: Self-pay | Admitting: Family Medicine

## 2020-12-03 DIAGNOSIS — Z1231 Encounter for screening mammogram for malignant neoplasm of breast: Secondary | ICD-10-CM

## 2021-01-25 ENCOUNTER — Other Ambulatory Visit: Payer: Self-pay

## 2021-01-25 ENCOUNTER — Ambulatory Visit
Admission: RE | Admit: 2021-01-25 | Discharge: 2021-01-25 | Disposition: A | Discharge status: Home / Self Care | Payer: 59 | Source: Ambulatory Visit | Attending: Family Medicine | Admitting: Family Medicine

## 2021-01-25 DIAGNOSIS — Z1231 Encounter for screening mammogram for malignant neoplasm of breast: Secondary | ICD-10-CM
